# Patient Record
Sex: Female | Born: 1965 | Hispanic: Yes | Smoking: Never smoker
Health system: Southern US, Community
[De-identification: ages and names within clinical notes are randomized; demographics above are authoritative.]

## PROBLEM LIST (undated history)

## (undated) DIAGNOSIS — E039 Hypothyroidism, unspecified: Secondary | ICD-10-CM

## (undated) DIAGNOSIS — M5136 Other intervertebral disc degeneration, lumbar region: Secondary | ICD-10-CM

## (undated) DIAGNOSIS — M47814 Spondylosis without myelopathy or radiculopathy, thoracic region: Secondary | ICD-10-CM

## (undated) DIAGNOSIS — E041 Nontoxic single thyroid nodule: Secondary | ICD-10-CM

## (undated) DIAGNOSIS — U071 COVID-19: Secondary | ICD-10-CM

## (undated) HISTORY — PX: TONSILLECTOMY: SUR1361

## (undated) HISTORY — PX: ABDOMINAL HYSTERECTOMY: SHX81

## (undated) HISTORY — DX: Other intervertebral disc degeneration, lumbar region: M51.36

## (undated) HISTORY — DX: Spondylosis without myelopathy or radiculopathy, thoracic region: M47.814

## (undated) HISTORY — DX: Nontoxic single thyroid nodule: E04.1

## (undated) HISTORY — DX: Hypothyroidism, unspecified: E03.9

---

## 2018-06-28 ENCOUNTER — Other Ambulatory Visit: Payer: Self-pay

## 2018-06-28 ENCOUNTER — Encounter (HOSPITAL_COMMUNITY): Payer: Self-pay | Admitting: Emergency Medicine

## 2018-06-28 ENCOUNTER — Emergency Department (HOSPITAL_COMMUNITY)
Admission: EM | Admit: 2018-06-28 | Discharge: 2018-06-28 | Disposition: A | Discharge status: Home / Self Care | Payer: Self-pay | Attending: Emergency Medicine | Admitting: Emergency Medicine

## 2018-06-28 DIAGNOSIS — B349 Viral infection, unspecified: Secondary | ICD-10-CM | POA: Insufficient documentation

## 2018-06-28 NOTE — ED Provider Notes (Signed)
[facility]-EMERGENCY DEPT Provider Note   CSN: [id number] Arrival date & time: 06/28/18  1625    History   Chief Complaint Chief Complaint  Patient presents with  . Fever  . Shortness of Breath  . Cough  . Sore Throat  . Otalgia    HPI Krystal Reyes is a 53 y.o. female.     HPI   She presents for evaluation of sore throat, cough, fever, chills, general achiness, for 4 days.  No known sick exposures.  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  She denies tobacco abuse, asthma, heart failure, or chronic illnesses.  There are no other known modifying factors.   History reviewed. No pertinent past medical history.  There are no active problems to display for this patient.   Past Surgical History:  Procedure Laterality Date  . ABDOMINAL HYSTERECTOMY    . TONSILLECTOMY       OB History   No obstetric history on file.      Home Medications    Prior to Admission medications   Not on File    Family History No family history on file.  Social History Social History   Tobacco Use  . Smoking status: Never Smoker  . Smokeless tobacco: Never Used  Substance Use Topics  . Alcohol use: Not on file  . Drug use: Not on file     Allergies   Patient has no known allergies.   Review of Systems Review of Systems  All other systems reviewed and are negative.    Physical Exam Updated Vital Signs BP 118/89 (BP Location: Left Arm)   Pulse 70   Temp 98.3 F (36.8 C) (Oral)   Resp 19   SpO2 99%   Physical Exam Vitals signs and nursing note reviewed.  Constitutional:      General: She is not in acute distress.    Appearance: She is well-developed. She is not ill-appearing or diaphoretic.  HENT:     Head: Normocephalic and atraumatic.     Mouth/Throat:     Pharynx: No pharyngeal swelling.  Eyes:     Conjunctiva/sclera: Conjunctivae normal.     Pupils: Pupils are equal, round, and reactive to  light.  Neck:     Musculoskeletal: Normal range of motion and neck supple.     Trachea: Phonation normal.  Cardiovascular:     Rate and Rhythm: Normal rate and regular rhythm.  Pulmonary:     Effort: Pulmonary effort is normal. No respiratory distress.     Breath sounds: Normal breath sounds. No stridor. No wheezing or rhonchi.     Comments: Persistent dry cough. Chest:     Chest wall: No tenderness.  Abdominal:     General: There is no distension.     Palpations: Abdomen is soft.     Tenderness: There is no abdominal tenderness. There is no guarding.  Musculoskeletal: Normal range of motion.  Skin:    General: Skin is warm and dry.  Neurological:     Mental Status: She is alert and oriented to person, place, and time.     Cranial Nerves: No cranial nerve deficit.     Sensory: No sensory deficit.     Motor: No abnormal muscle tone.     Coordination: Coordination normal.  Psychiatric:        Behavior: Behavior normal.        Thought Content: Thought content normal.  Judgment: Judgment normal.      ED Treatments / Results  Labs (all labs ordered are listed, but only abnormal results are displayed) Labs Reviewed - No data to display  EKG None  Radiology No results found.  Procedures Procedures (including critical care time)  Medications Ordered in ED Medications - No data to display   Initial Impression / Assessment and Plan / ED Course  I have reviewed the triage vital signs and the nursing notes.  Pertinent labs & imaging results that were available during my care of the patient were reviewed by me and considered in my medical decision making (see chart for details).         Patient Vitals for the past 24 hrs:  BP Temp Temp src Pulse Resp SpO2  06/28/18 1639 118/89 98.3 F (36.8 C) Oral 70 19 99 %  06/28/18 1636 - - - 70 - 99 %    4:50 PM Reevaluation with update and discussion. After initial assessment and treatment, an updated evaluation  reveals no change in clinical status.  Findings discussed with the patient and all questions were answered. Mancel Bale   Medical Decision Making: Short-term illness, with fever, and cough.  Vital signs normal.  Suspect viral illness.  No known sick contacts.  Differential diagnosis includes Covid-19, influenza, nonspecific URI.  Doubt serious bacterial infection, metabolic instability or impending vascular collapse.  CRITICAL CARE-no Performed by: Mancel Bale   Nursing Notes Reviewed/ Care Coordinated Applicable Imaging Reviewed Interpretation of Laboratory Data incorporated into ED treatment  The patient appears reasonably screened and/or stabilized for discharge and I doubt any other medical condition or other [facility] requiring further screening, evaluation, or treatment in the ED at this time prior to discharge.  Plan: Home Medications-OTC symptomatic treatment; Home Treatments-rest, fluids, self quarantine for 2 weeks; return here if the recommended treatment, does not improve the symptoms; Recommended follow up-PCP of choice PRN    Final Clinical Impressions(s) / ED Diagnoses   Final diagnoses:  Viral illness    ED Discharge Orders    None       Mancel Bale, MD 06/28/18 1656

## 2018-06-28 NOTE — ED Triage Notes (Signed)
Pt reports took tylenol at 345pm today.

## 2018-06-28 NOTE — ED Triage Notes (Signed)
Pt c/o dry cough, fevers, congestion, body aches, chills, sore throat, ear pain for 4 days. 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 denies any recent travel.

## 2018-06-28 NOTE — Discharge Instructions (Addendum)
Get plenty of rest and drink lots of fluids.  Use Tylenol, every 4 hours for fever.  For cough use Robitussin-DM.  You likely have a virus, and may have Covid-19.  Do not work, or expose yourself to other people, for at least 2 weeks.  You need to self isolate and quarantine yourself, for 2 weeks.  There is no specific treatment for your condition.  If your condition worsens you can return here for reassessment.  Watch for worsening cough, fever, inability to eat, weakness or confusion.

## 2018-12-19 ENCOUNTER — Encounter: Payer: Self-pay | Admitting: Gastroenterology

## 2019-01-22 ENCOUNTER — Other Ambulatory Visit: Payer: Self-pay

## 2019-01-22 NOTE — Progress Notes (Signed)
New paper paperwork

## 2019-01-23 ENCOUNTER — Ambulatory Visit: Payer: Self-pay | Admitting: Gastroenterology

## 2019-03-03 ENCOUNTER — Emergency Department (HOSPITAL_COMMUNITY): Payer: Self-pay

## 2019-03-03 ENCOUNTER — Encounter (HOSPITAL_COMMUNITY): Payer: Self-pay | Admitting: Emergency Medicine

## 2019-03-03 ENCOUNTER — Other Ambulatory Visit: Payer: Self-pay

## 2019-03-03 ENCOUNTER — Emergency Department (HOSPITAL_COMMUNITY)
Admission: EM | Admit: 2019-03-03 | Discharge: 2019-03-03 | Disposition: A | Discharge status: Home / Self Care | Payer: Self-pay | Attending: Emergency Medicine | Admitting: Emergency Medicine

## 2019-03-03 DIAGNOSIS — R197 Diarrhea, unspecified: Secondary | ICD-10-CM

## 2019-03-03 DIAGNOSIS — R059 Cough, unspecified: Secondary | ICD-10-CM

## 2019-03-03 DIAGNOSIS — U071 COVID-19: Secondary | ICD-10-CM | POA: Insufficient documentation

## 2019-03-03 DIAGNOSIS — R05 Cough: Secondary | ICD-10-CM

## 2019-03-03 DIAGNOSIS — R0602 Shortness of breath: Secondary | ICD-10-CM

## 2019-03-03 DIAGNOSIS — E039 Hypothyroidism, unspecified: Secondary | ICD-10-CM | POA: Insufficient documentation

## 2019-03-03 HISTORY — DX: COVID-19: U07.1

## 2019-03-03 LAB — BASIC METABOLIC PANEL
Anion gap: 8 (ref 5–15)
BUN: 9 mg/dL (ref 6–20)
CO2: 24 mmol/L (ref 22–32)
Calcium: 8.6 mg/dL — ABNORMAL LOW (ref 8.9–10.3)
Chloride: 104 mmol/L (ref 98–111)
Creatinine, Ser: 0.86 mg/dL (ref 0.44–1.00)
GFR calc Af Amer: >60 mL/min (ref >60)
GFR calc non Af Amer: >60 mL/min (ref >60)
Glucose, Bld: 98 mg/dL (ref 70–99)
Potassium: 4.2 mmol/L (ref 3.5–5.1)
Sodium: 136 mmol/L (ref 135–145)

## 2019-03-03 LAB — I-STAT BETA HCG BLOOD, ED (MC, WL, AP ONLY): I-stat hCG, quantitative: <5 m[IU]/mL (ref <5)

## 2019-03-03 LAB — CBC
HCT: 46.6 % — ABNORMAL HIGH (ref 36.0–46.0)
Hemoglobin: 14.7 g/dL (ref 12.0–15.0)
MCH: 29.1 pg (ref 26.0–34.0)
MCHC: 31.5 g/dL (ref 30.0–36.0)
MCV: 92.3 fL (ref 80.0–100.0)
Platelets: 130 10*3/uL — ABNORMAL LOW (ref 150–400)
RBC: 5.05 MIL/uL (ref 3.87–5.11)
RDW: 12.4 % (ref 11.5–15.5)
WBC: 4.1 10*3/uL (ref 4.0–10.5)
nRBC: 0 % (ref 0.0–0.2)

## 2019-03-03 LAB — D-DIMER, QUANTITATIVE: D-Dimer, Quant: 1.42 ug/mL-FEU — ABNORMAL HIGH (ref 0.00–0.50)

## 2019-03-03 LAB — TROPONIN I (HIGH SENSITIVITY): Troponin I (High Sensitivity): 4 ng/L (ref <18)

## 2019-03-03 MED ORDER — IOHEXOL 350 MG/ML SOLN
63.0000 mL | Freq: Once | INTRAVENOUS | Status: AC | PRN
  Administered 2019-03-03: 63 mL via INTRAVENOUS

## 2019-03-03 MED ORDER — KETOROLAC TROMETHAMINE 30 MG/ML IJ SOLN
30.0000 mg | Freq: Once | INTRAMUSCULAR | Status: AC
  Administered 2019-03-03: 30 mg via INTRAVENOUS
  Filled 2019-03-03: qty 1

## 2019-03-03 MED ORDER — ONDANSETRON 4 MG PO TBDP: 4.0000 mg | ORAL_TABLET | Freq: Three times a day (TID) | ORAL | 0 refills | Status: DC | PRN

## 2019-03-03 MED ORDER — AEROCHAMBER PLUS FLO-VU LARGE MISC
Status: AC
  Administered 2019-03-03: 19:00:00
  Filled 2019-03-03: qty 1

## 2019-03-03 MED ORDER — ONDANSETRON HCL 4 MG/2ML IJ SOLN
4.0000 mg | Freq: Once | INTRAMUSCULAR | Status: AC
  Administered 2019-03-03: 4 mg via INTRAVENOUS
  Filled 2019-03-03: qty 2

## 2019-03-03 MED ORDER — LOPERAMIDE HCL 2 MG PO CAPS: 2.0000 mg | ORAL_CAPSULE | Freq: Four times a day (QID) | ORAL | 0 refills | Status: AC | PRN

## 2019-03-03 MED ORDER — BENZONATATE 100 MG PO CAPS: 100.0000 mg | ORAL_CAPSULE | Freq: Three times a day (TID) | ORAL | 0 refills | Status: DC

## 2019-03-03 MED ORDER — ALBUTEROL SULFATE HFA 108 (90 BASE) MCG/ACT IN AERS: 2.0000 | INHALATION_SPRAY | RESPIRATORY_TRACT | 0 refills | Status: AC | PRN

## 2019-03-03 MED ORDER — SODIUM CHLORIDE 0.9% FLUSH
3.0000 mL | Freq: Once | INTRAVENOUS | Status: AC
  Administered 2019-03-03: 3 mL via INTRAVENOUS

## 2019-03-03 MED ORDER — SODIUM CHLORIDE 0.9 % IV BOLUS
1000.0000 mL | Freq: Once | INTRAVENOUS | Status: AC
  Administered 2019-03-03: 1000 mL via INTRAVENOUS

## 2019-03-03 MED ORDER — ALBUTEROL SULFATE HFA 108 (90 BASE) MCG/ACT IN AERS
6.0000 | INHALATION_SPRAY | Freq: Once | RESPIRATORY_TRACT | Status: AC
  Administered 2019-03-03: 6 via RESPIRATORY_TRACT
  Filled 2019-03-03: qty 6.7

## 2019-03-03 NOTE — ED Notes (Signed)
Patient verbalizes understanding of discharge instructions. Opportunity for questioning and answers were provided. Armband removed by staff, pt discharged from ED.  

## 2019-03-03 NOTE — ED Provider Notes (Signed)
[facility] EMERGENCY DEPARTMENT Provider Note   CSN: [id number] Arrival date & time: 03/03/19  1420    History   Chief Complaint Chief Complaint  Patient presents with   COVID   Fever   Shortness of Breath   HPI Krystal Reyes is a 53 y.o. female with past medical history significant for hypothyroidism, hysterectomy who presents for evaluation of multiple complaints.  Patient tested positive for COVID-19 after work, [employer] on 03/28/2019.  Patient states she has had a nonproductive cough, pleuritic chest pain, fever, chills, body aches and pains, headache, diarrhea over the last week.  She has been taking Tylenol ibuprofen intermittently.  She did have a televisit with her PCP who prescribed her a cough medicine however she has not picked this up from the drugstore.  Patient states she is also felt short of breath.  Chest pain is pleuritic in nature.  No associated diaphoresis, dizziness, lightheadedness.  Denies chest pain radiating to her back, left arm or jaw.  CO peuritic in nature.  No prior history history of PE, DVT, ACS.  T-max 102.5.  Denies sudden onset medical headache, dizziness, lightheadedness, vision changes, dyspnea, rhinorrhea, neck pain, neck stiffness, congestion, rhinorrhea, sore throat, dysuria, melena, BRBPR, unilateral weakness, abdominal pain, melena, dysphagia. Admits to decreased appetite however has been able to tolerate. Denies additional aggravating or alleviating factors.  No recent surgery, most recently, history of malignancy. No swelling to legs.  No recent antibiotic use or travel.   History obtained from patient and past medical records. No interpretor was used.     HPI  Past Medical History:  Diagnosis Date   COVID-19    Degeneration of lumbar intervertebral disc    Hypothyroidism    Nodular thyroid disease    Thoracic spondylosis     There are no active problems to display for this patient.   Past Surgical  History:  Procedure Laterality Date   ABDOMINAL HYSTERECTOMY     CESAREAN SECTION     TONSILLECTOMY       OB History   No obstetric history on file.      Home Medications    Prior to Admission medications   Medication Sig Start Date End Date Taking? Authorizing Provider  albuterol (VENTOLIN HFA) 108 (90 Base) MCG/ACT inhaler Inhale 2 puffs into the lungs every 4 (four) hours as needed for wheezing or shortness of breath. 03/03/19   Beretta Ginsberg A, PA-C  benzonatate (TESSALON) 100 MG capsule Take 1 capsule (100 mg total) by mouth every 8 (eight) hours. 03/03/19   Tasheem Elms A, PA-C  loperamide (IMODIUM) 2 MG capsule Take 1 capsule (2 mg total) by mouth 4 (four) times daily as needed for diarrhea or loose stools. 03/03/19   Gracen Ringwald A, PA-C  ondansetron (ZOFRAN ODT) 4 MG disintegrating tablet Take 1 tablet (4 mg total) by mouth every 8 (eight) hours as needed for nausea or vomiting. 03/03/19   Athenia Rys A, PA-C    Family History Family History  Problem Relation Age of Onset   Hypertension Mother    Diabetes Father    Asthma Father    Diabetes Sister        x 4 sisters   Hypertension Sister    Diabetes Maternal Grandmother    Asthma Son     Social History Social History   Tobacco Use   Smoking status: Never Smoker   Smokeless tobacco: Never Used  Substance Use Topics   Alcohol use: Never  Frequency: Never   Drug use: Never     Allergies   Patient has no known allergies.   Review of Systems Review of Systems  Constitutional: Positive for activity change, appetite change, chills, fatigue and fever.  HENT: Negative.   Eyes: Negative.   Respiratory: Positive for cough and shortness of breath. Negative for apnea, choking, chest tightness, wheezing and stridor.   Cardiovascular: Positive for chest pain (Pleuritic). Negative for palpitations and leg swelling.  Gastrointestinal: Positive for diarrhea and nausea. Negative for  abdominal distention, abdominal pain, anal bleeding, blood in stool, constipation, rectal pain and vomiting.  Genitourinary: Negative.   Musculoskeletal: Negative.   Skin: Negative.   Neurological: Positive for headaches. Negative for dizziness, tremors, seizures, syncope, facial asymmetry, speech difficulty, weakness, light-headedness and numbness.  Hematological: Negative.   All other systems reviewed and are negative.   Physical Exam Updated Vital Signs BP 134/77    Pulse 88    Temp 99.4 F (37.4 C) (Oral)    Resp (!) 22    SpO2 100%   Physical Exam Vitals signs and nursing note reviewed.  Constitutional:      General: She is not in acute distress.    Appearance: She is well-developed. She is ill-appearing. She is not toxic-appearing or diaphoretic.  HENT:     Head: Normocephalic and atraumatic.     Jaw: There is normal jaw occlusion.     Right Ear: Tympanic membrane, ear canal and external ear normal. There is no impacted cerumen. No hemotympanum. Tympanic membrane is not injected, scarred, perforated, erythematous, retracted or bulging.     Left Ear: Tympanic membrane, ear canal and external ear normal. There is no impacted cerumen. No hemotympanum. Tympanic membrane is not injected, scarred, perforated, erythematous, retracted or bulging.     Ears:     Comments: No Mastoid tenderness.    Nose:     Comments: Clear rhinorrhea and congestion to bilateral nares.  No sinus tenderness.    Mouth/Throat:     Mouth: Mucous membranes are moist.     Comments: Posterior oropharynx clear.  Mucous membranes moist.  Tonsils without erythema or exudate.  Uvula midline without deviation.  No evidence of PTA or RPA.  No drooling, dysphasia or trismus.  Phonation normal. Eyes:     Pupils: Pupils are equal, round, and reactive to light.     Comments: No horizontal, vertical or rotational nystagmus  Neck:     Musculoskeletal: Normal range of motion.     Trachea: Trachea and phonation normal.      Meningeal: Brudzinski's sign and Kernig's sign absent.     Comments: Full active and passive ROM without pain No midline or paraspinal tenderness No nuchal rigidity or meningeal signs  Cardiovascular:     Rate and Rhythm: Normal rate.     Comments: No murmurs rubs or gallops. Pulmonary:     Effort: No respiratory distress.     Comments: Clear to auscultation bilaterally without wheeze, rhonchi or rales.  No accessory muscle usage.  Able speak in full sentences. Abdominal:     General: There is no distension.     Comments: Soft, nontender without rebound or guarding.  No CVA tenderness.  Musculoskeletal: Normal range of motion.     Comments: Moves all 4 extremities without difficulty.  Lower extremities without edema, erythema or warmth.  Skin:    General: Skin is warm and dry.     Comments: Brisk capillary refill.  No rashes or lesions.  Neurological:  Mental Status: She is alert.     Comments: Mental Status:  Alert, oriented, thought content appropriate. Speech fluent without evidence of aphasia. Able to follow 2 step commands without difficulty.  Cranial Nerves:  II:  Peripheral visual fields grossly normal, pupils equal, round, reactive to light III,IV, VI: ptosis not present, extra-ocular motions intact bilaterally  V,VII: smile symmetric, facial light touch sensation equal VIII: hearing grossly normal bilaterally  IX,X: midline uvula rise  XI: bilateral shoulder shrug equal and strong XII: midline tongue extension  Motor:  5/5 in upper and lower extremities bilaterally including strong and equal grip strength and dorsiflexion/plantar flexion Sensory: Pinprick and light touch normal in all extremities.  Deep Tendon Reflexes: 2+ and symmetric  Cerebellar: normal finger-to-nose with bilateral upper extremities Gait: normal gait and balance CV: distal pulses palpable throughout      ED Treatments / Results  Labs (all labs ordered are listed, but only abnormal results are  displayed) Labs Reviewed  CBC - Abnormal; Notable for the following components:      Result Value   HCT 46.6 (*)    Platelets 130 (*)    All other components within normal limits  D-DIMER, QUANTITATIVE (NOT AT [facility]) - Abnormal; Notable for the following components:   D-Dimer, Quant 1.42 (*)    All other components within normal limits  BASIC METABOLIC PANEL - Abnormal; Notable for the following components:   Calcium 8.6 (*)    All other components within normal limits  I-STAT BETA HCG BLOOD, ED ([facility], [facility], [facility] ONLY)  TROPONIN I (HIGH SENSITIVITY)    EKG None  Radiology Ct Angio Chest Pe W/cm &/or Wo Cm  Result Date: 03/03/2019 CLINICAL DATA:  53 year old female with shortness of breath. Positive COVID-19. EXAM: CT ANGIOGRAPHY CHEST WITH CONTRAST TECHNIQUE: Multidetector CT imaging of the chest was performed using the standard protocol during bolus administration of intravenous contrast. Multiplanar CT image reconstructions and MIPs were obtained to evaluate the vascular anatomy. CONTRAST:  63mL OMNIPAQUE IOHEXOL 350 MG/ML SOLN COMPARISON:  Chest radiograph dated 03/03/2019. FINDINGS: Cardiovascular: There is no cardiomegaly or pericardial effusion. The thoracic aorta is unremarkable. Evaluation of the pulmonary arteries is limited due to streak artifact. No pulmonary artery embolus identified. Mediastinum/Nodes: There is no hilar or mediastinal adenopathy. The esophagus and the thyroid gland are grossly unremarkable as visualized. No mediastinal fluid collection. Lungs/Pleura: Patchy bilateral peripheral and subpleural ground-glass densities consistent with multifocal pneumonia, likely atypical or viral in etiology, including COVID-19. Clinical correlation and follow-up to resolution recommended. There is no pleural effusion or pneumothorax. The central airways are patent. Upper Abdomen: No acute abnormality. Musculoskeletal: No chest wall abnormality. No acute or significant osseous findings.  Review of the MIP images confirms the above findings. IMPRESSION: 1. No CT evidence of pulmonary embolism. 2. Bilateral peripheral and subpleural ground-glass densities consistent with multifocal pneumonia, likely atypical or viral in etiology. Clinical correlation and follow-up to resolution recommended. Electronically Signed   By: Elgie CollardArash  Radparvar M.D.   On: 03/03/2019 21:02   Dg Chest Portable 1 View  Result Date: 03/03/2019 CLINICAL DATA:  COVID-19 positive, vomiting, diarrhea, fever, shortness of breath and central chest pain EXAM: PORTABLE CHEST 1 VIEW COMPARISON:  Portable exam 1628 hours compared to 12/26/2017 FINDINGS: Normal heart size, mediastinal contours, and pulmonary vascularity. Lungs clear. No pulmonary infiltrate, pleural effusion or pneumothorax. Osseous structures unremarkable. IMPRESSION: No acute abnormalities. Electronically Signed   By: Ulyses SouthwardMark  Boles M.D.   On: 03/03/2019 17:12    Procedures  Procedures (including critical care time)  Medications Ordered in ED Medications  sodium chloride flush (NS) 0.9 % injection 3 mL (3 mLs Intravenous Given 03/03/19 1853)  sodium chloride 0.9 % bolus 1,000 mL (0 mLs Intravenous Stopped 03/03/19 2000)  ondansetron (ZOFRAN) injection 4 mg (4 mg Intravenous Given 03/03/19 1848)  ketorolac (TORADOL) 30 MG/ML injection 30 mg (30 mg Intravenous Given 03/03/19 1848)  albuterol (VENTOLIN HFA) 108 (90 Base) MCG/ACT inhaler 6 puff (6 puffs Inhalation Given 03/03/19 1915)  AeroChamber Plus Flo-Vu Large MISC (  Given 03/03/19 1915)  iohexol (OMNIPAQUE) 350 MG/ML injection 63 mL (63 mLs Intravenous Contrast Given 03/03/19 2053)    Initial Impression / Assessment and Plan / ED Course  I have reviewed the triage vital signs and the nursing notes.  Pertinent labs & imaging results that were available during my care of the patient were reviewed by me and considered in my medical decision making (see chart for details).  53 year old female appears nonseptic  however appears to not feel well presents for evaluation of multiple complaints.  Afebrile.  Known Covid positive on 03/01/2019.  Patient has pleuritic chest pain, shortness of breath, persistent nausea, headache, body aches and pains as well as diarrhea Rea.  Her abdomen is soft, nontender.  Nonfocal abdominal exam.  Nonsurgical abdomen.  Clear.  She is able speak in full sentences without difficulty.  No neck stiffness or neck rigidity.  No meningismus.  No evidence of DVT on exam.  She is febrile however not tachycardic, tachypneic or hypoxic.  She did take 1000 mg of Tylenol 30 minutes PTA.  Generalized headache however denies sudden onset thunderclap headache, dizziness, vision changes.No sudden onset thunderclap headache.   Reassessed.  Headache resolved with Toradol.  She ambulated in room with oxygen saturations greater than 96% on room air.  She has no tachycardia or tachypnea.  She did defervesce with the Toradol from the emergency department.  Her abdomen exam remains benign.  No focal tenderness.  I have low suspicion for bowel obstruction, acute bacterial infectious process as cause of her diarrhea.  Do not think she needs CT imaging at this time.  Denies any recent travel or antibiotic use.  I have low suspicion for C. difficile or need for antibiotics for her diarrhea.  She did have an elevated D-dimer.  Her CTA of her chest did not show any evidence of pulmonary embolism however she does have multifocal pneumonia likely atypical versus viral in etiology.  Given known Covid positive I feel this is likely the cause.  Patient does not appear septic or ill.  Her vital signs remained stable in the emergency department.  Discussed symptomatic management with patient at home.  Prescriptions given.  Her lungs and heart remain clear.  She had negative delta troponins.  I have low suspicion for ACS, PE, dissection, myocarditis, pericarditis as cause of her symptoms.  She is tolerating p.o. intake in ED  without difficulty.  Ambulatory without difficulty and continues to be neurologically intact.HA presentation non concerning for [facility], ICH, Meningitis, or temporal arteritis.  Patient is nontoxic, nonseptic appearing, in no apparent distress.  Patient's pain and other symptoms adequately managed in emergency department.  Fluid bolus given.  Labs, imaging and vitals reviewed.  Patient does not meet the SIRS or Sepsis criteria.  On repeat exam patient does not have a surgical abdomin and there are no peritoneal signs.  No indication of appendicitis, bowel obstruction, bowel perforation, cholecystitis, diverticulitis, PID or ectopic pregnancy.  Patient  discharged home with symptomatic treatment and given strict instructions for follow-up with their primary care physician.  I have also discussed reasons to return immediately to the ER.  Patient expresses understanding and agrees with plan.       Krystal Reyes was evaluated in Emergency Department on 03/03/2019 for the symptoms described in the history of present illness. She was evaluated in the context of the global COVID-19 pandemic, which necessitated consideration that the patient might be at risk for infection with the SARS-CoV-2 virus that causes COVID-19. Institutional protocols and algorithms that pertain to the evaluation of patients at risk for COVID-19 are in a state of rapid change based on information released by regulatory bodies including the CDC and federal and state organizations. These policies and algorithms were followed during the patient's care in the ED. Final Clinical Impressions(s) / ED Diagnoses   Final diagnoses:  COVID-19  Cough  SOB (shortness of breath)  Diarrhea, unspecified type    ED Discharge Orders         Ordered    benzonatate (TESSALON) 100 MG capsule  Every 8 hours     03/03/19 2125    albuterol (VENTOLIN HFA) 108 (90 Base) MCG/ACT inhaler  Every 4 hours PRN     03/03/19 2125    loperamide (IMODIUM) 2 MG capsule   4 times daily PRN     03/03/19 2125    ondansetron (ZOFRAN ODT) 4 MG disintegrating tablet  Every 8 hours PRN     03/03/19 2125           Sion Thane A, PA-C 03/03/19 2130    Sabas Sous, MD 03/04/19 2018

## 2019-03-03 NOTE — Discharge Instructions (Signed)
I have given you a few prescriptions to help with your symptoms from Covid.  I have given you Zofran.  This is a disintegrating tablet they placed under your tongue for nausea and vomiting.  I have also written you prescription for Imodium for your diarrhea.  You do have an albuterol inhaler that you taking home with you from the emergency department however I did write for additional prescription of this.  You may take 3 to 4 puffs every 4 hours as needed for shortness of breath.  I also write for Tessalon Perles to help with your cough.  Make sure to drink plenty of fluids and rest.  Continue to take Tylenol and ibuprofen as needed for fever.  Do not take more than 4000 mg Tylenol or more than 2400 mg ibuprofen daily.  Return to the emergency department if you develop any new or worsening symptoms.

## 2019-03-03 NOTE — ED Notes (Signed)
Meal given. Dropped off by family.

## 2019-03-03 NOTE — ED Triage Notes (Signed)
Pt states she is COVID +.  C/o vomiting, diarrhea, fever, SOB, and pain to center of chest.

## 2019-03-07 ENCOUNTER — Emergency Department (HOSPITAL_COMMUNITY): Payer: HRSA Program

## 2019-03-07 ENCOUNTER — Other Ambulatory Visit: Payer: Self-pay

## 2019-03-07 ENCOUNTER — Inpatient Hospital Stay (HOSPITAL_COMMUNITY)
Admission: EM | Admit: 2019-03-07 | Discharge: 2019-03-11 | DRG: 177 | Disposition: A | Discharge status: Home / Self Care | Payer: HRSA Program | Attending: Family Medicine | Admitting: Family Medicine

## 2019-03-07 ENCOUNTER — Encounter (HOSPITAL_COMMUNITY): Payer: Self-pay | Admitting: Emergency Medicine

## 2019-03-07 DIAGNOSIS — Z8249 Family history of ischemic heart disease and other diseases of the circulatory system: Secondary | ICD-10-CM

## 2019-03-07 DIAGNOSIS — F419 Anxiety disorder, unspecified: Secondary | ICD-10-CM | POA: Diagnosis not present

## 2019-03-07 DIAGNOSIS — U071 COVID-19: Secondary | ICD-10-CM | POA: Diagnosis present

## 2019-03-07 DIAGNOSIS — D72828 Other elevated white blood cell count: Secondary | ICD-10-CM | POA: Diagnosis not present

## 2019-03-07 DIAGNOSIS — Y92230 Patient room in hospital as the place of occurrence of the external cause: Secondary | ICD-10-CM | POA: Diagnosis not present

## 2019-03-07 DIAGNOSIS — J1289 Other viral pneumonia: Secondary | ICD-10-CM | POA: Diagnosis present

## 2019-03-07 DIAGNOSIS — I493 Ventricular premature depolarization: Secondary | ICD-10-CM | POA: Diagnosis present

## 2019-03-07 DIAGNOSIS — M5136 Other intervertebral disc degeneration, lumbar region: Secondary | ICD-10-CM | POA: Diagnosis present

## 2019-03-07 DIAGNOSIS — E039 Hypothyroidism, unspecified: Secondary | ICD-10-CM | POA: Diagnosis present

## 2019-03-07 DIAGNOSIS — Z9089 Acquired absence of other organs: Secondary | ICD-10-CM

## 2019-03-07 DIAGNOSIS — Z833 Family history of diabetes mellitus: Secondary | ICD-10-CM

## 2019-03-07 DIAGNOSIS — Z90710 Acquired absence of both cervix and uterus: Secondary | ICD-10-CM

## 2019-03-07 DIAGNOSIS — M47814 Spondylosis without myelopathy or radiculopathy, thoracic region: Secondary | ICD-10-CM | POA: Diagnosis present

## 2019-03-07 DIAGNOSIS — R0902 Hypoxemia: Secondary | ICD-10-CM

## 2019-03-07 DIAGNOSIS — R0602 Shortness of breath: Secondary | ICD-10-CM | POA: Diagnosis present

## 2019-03-07 DIAGNOSIS — D72829 Elevated white blood cell count, unspecified: Secondary | ICD-10-CM | POA: Diagnosis not present

## 2019-03-07 DIAGNOSIS — Z888 Allergy status to other drugs, medicaments and biological substances status: Secondary | ICD-10-CM

## 2019-03-07 DIAGNOSIS — I2699 Other pulmonary embolism without acute cor pulmonale: Secondary | ICD-10-CM

## 2019-03-07 DIAGNOSIS — Z825 Family history of asthma and other chronic lower respiratory diseases: Secondary | ICD-10-CM

## 2019-03-07 DIAGNOSIS — J9601 Acute respiratory failure with hypoxia: Secondary | ICD-10-CM | POA: Diagnosis present

## 2019-03-07 DIAGNOSIS — T380X5A Adverse effect of glucocorticoids and synthetic analogues, initial encounter: Secondary | ICD-10-CM | POA: Diagnosis not present

## 2019-03-07 LAB — TRIGLYCERIDES: Triglycerides: 61 mg/dL (ref <150)

## 2019-03-07 LAB — CBC
HCT: 40.9 % (ref 36.0–46.0)
HCT: 39.5 % (ref 36.0–46.0)
Hemoglobin: 13.5 g/dL (ref 12.0–15.0)
Hemoglobin: 12.8 g/dL (ref 12.0–15.0)
MCH: 28.7 pg (ref 26.0–34.0)
MCH: 29.2 pg (ref 26.0–34.0)
MCHC: 33 g/dL (ref 30.0–36.0)
MCHC: 32.4 g/dL (ref 30.0–36.0)
MCV: 86.8 fL (ref 80.0–100.0)
MCV: 90 fL (ref 80.0–100.0)
Platelets: 179 10*3/uL (ref 150–400)
Platelets: 157 10*3/uL (ref 150–400)
RBC: 4.71 MIL/uL (ref 3.87–5.11)
RBC: 4.39 MIL/uL (ref 3.87–5.11)
RDW: 11.9 % (ref 11.5–15.5)
RDW: 11.9 % (ref 11.5–15.5)
WBC: 4.2 10*3/uL (ref 4.0–10.5)
WBC: 4.2 10*3/uL (ref 4.0–10.5)
nRBC: 0 % (ref 0.0–0.2)
nRBC: 0 % (ref 0.0–0.2)

## 2019-03-07 LAB — BASIC METABOLIC PANEL
Anion gap: 9 (ref 5–15)
BUN: 10 mg/dL (ref 6–20)
CO2: 25 mmol/L (ref 22–32)
Calcium: 9.2 mg/dL (ref 8.9–10.3)
Chloride: 105 mmol/L (ref 98–111)
Creatinine, Ser: 0.71 mg/dL (ref 0.44–1.00)
GFR calc Af Amer: >60 mL/min (ref >60)
GFR calc non Af Amer: >60 mL/min (ref >60)
Glucose, Bld: 104 mg/dL — ABNORMAL HIGH (ref 70–99)
Potassium: 4.1 mmol/L (ref 3.5–5.1)
Sodium: 139 mmol/L (ref 135–145)

## 2019-03-07 LAB — COMPREHENSIVE METABOLIC PANEL
ALT: 23 U/L (ref 0–44)
AST: 35 U/L (ref 15–41)
Albumin: 2.8 g/dL — ABNORMAL LOW (ref 3.5–5.0)
Alkaline Phosphatase: 34 U/L — ABNORMAL LOW (ref 38–126)
Anion gap: 10 (ref 5–15)
BUN: 10 mg/dL (ref 6–20)
CO2: 23 mmol/L (ref 22–32)
Calcium: 8.8 mg/dL — ABNORMAL LOW (ref 8.9–10.3)
Chloride: 104 mmol/L (ref 98–111)
Creatinine, Ser: 0.78 mg/dL (ref 0.44–1.00)
GFR calc Af Amer: >60 mL/min (ref >60)
GFR calc non Af Amer: >60 mL/min (ref >60)
Glucose, Bld: 91 mg/dL (ref 70–99)
Potassium: 3.9 mmol/L (ref 3.5–5.1)
Sodium: 137 mmol/L (ref 135–145)
Total Bilirubin: 0.6 mg/dL (ref 0.3–1.2)
Total Protein: 5.7 g/dL — ABNORMAL LOW (ref 6.5–8.1)

## 2019-03-07 LAB — I-STAT BETA HCG BLOOD, ED (MC, WL, AP ONLY): I-stat hCG, quantitative: <5 m[IU]/mL (ref <5)

## 2019-03-07 LAB — FIBRINOGEN: Fibrinogen: 570 mg/dL — ABNORMAL HIGH (ref 210–475)

## 2019-03-07 LAB — PROCALCITONIN: Procalcitonin: <0.1 ng/mL

## 2019-03-07 LAB — D-DIMER, QUANTITATIVE: D-Dimer, Quant: 1.73 ug/mL-FEU — ABNORMAL HIGH (ref 0.00–0.50)

## 2019-03-07 LAB — CBG MONITORING, ED
Glucose-Capillary: 130 mg/dL — ABNORMAL HIGH (ref 70–99)
Glucose-Capillary: 146 mg/dL — ABNORMAL HIGH (ref 70–99)

## 2019-03-07 LAB — POC SARS CORONAVIRUS 2 AG -  ED: SARS Coronavirus 2 Ag: POSITIVE — AB

## 2019-03-07 LAB — TROPONIN I (HIGH SENSITIVITY)
Troponin I (High Sensitivity): 3 ng/L (ref <18)
Troponin I (High Sensitivity): 4 ng/L (ref <18)

## 2019-03-07 LAB — CREATININE, SERUM
Creatinine, Ser: 0.72 mg/dL (ref 0.44–1.00)
GFR calc Af Amer: >60 mL/min (ref >60)
GFR calc non Af Amer: >60 mL/min (ref >60)

## 2019-03-07 LAB — HIV ANTIBODY (ROUTINE TESTING W REFLEX): HIV Screen 4th Generation wRfx: NONREACTIVE

## 2019-03-07 LAB — LACTATE DEHYDROGENASE: LDH: 284 U/L — ABNORMAL HIGH (ref 98–192)

## 2019-03-07 LAB — LACTIC ACID, PLASMA
Lactic Acid, Venous: 0.8 mmol/L (ref 0.5–1.9)
Lactic Acid, Venous: 0.8 mmol/L (ref 0.5–1.9)

## 2019-03-07 LAB — ABO/RH: ABO/RH(D): B POS

## 2019-03-07 LAB — FERRITIN: Ferritin: 194 ng/mL (ref 11–307)

## 2019-03-07 LAB — C-REACTIVE PROTEIN: CRP: 11.3 mg/dL — ABNORMAL HIGH (ref <1.0)

## 2019-03-07 MED ORDER — INSULIN ASPART 100 UNIT/ML ~~LOC~~ SOLN
0.0000 [IU] | Freq: Three times a day (TID) | SUBCUTANEOUS | Status: DC
  Administered 2019-03-09 – 2019-03-10 (×2): 1 [IU] via SUBCUTANEOUS
  Administered 2019-03-10: 23:00:00 2 [IU] via SUBCUTANEOUS

## 2019-03-07 MED ORDER — SODIUM CHLORIDE 0.9% FLUSH
3.0000 mL | Freq: Two times a day (BID) | INTRAVENOUS | Status: DC
  Administered 2019-03-07 – 2019-03-11 (×5): 3 mL via INTRAVENOUS

## 2019-03-07 MED ORDER — ENOXAPARIN SODIUM 40 MG/0.4ML ~~LOC~~ SOLN
40.0000 mg | SUBCUTANEOUS | Status: DC
  Administered 2019-03-07 – 2019-03-11 (×4): 40 mg via SUBCUTANEOUS
  Filled 2019-03-07 (×4): qty 0.4

## 2019-03-07 MED ORDER — SODIUM CHLORIDE 0.9 % IV SOLN
100.0000 mg | Freq: Every day | INTRAVENOUS | Status: AC
  Administered 2019-03-08 – 2019-03-11 (×4): 100 mg via INTRAVENOUS
  Filled 2019-03-07 (×3): qty 20
  Filled 2019-03-07: qty 100

## 2019-03-07 MED ORDER — BENZONATATE 100 MG PO CAPS
200.0000 mg | ORAL_CAPSULE | Freq: Three times a day (TID) | ORAL | Status: DC | PRN
  Administered 2019-03-07 – 2019-03-09 (×2): 200 mg via ORAL
  Filled 2019-03-07 (×2): qty 2

## 2019-03-07 MED ORDER — ALBUTEROL SULFATE HFA 108 (90 BASE) MCG/ACT IN AERS
2.0000 | INHALATION_SPRAY | RESPIRATORY_TRACT | Status: DC | PRN
  Administered 2019-03-08 – 2019-03-09 (×2): 2 via RESPIRATORY_TRACT
  Filled 2019-03-07: qty 6.7

## 2019-03-07 MED ORDER — HYDROMORPHONE HCL 1 MG/ML IJ SOLN
0.5000 mg | Freq: Once | INTRAMUSCULAR | Status: AC
  Administered 2019-03-07: 22:00:00 0.5 mg via INTRAVENOUS
  Filled 2019-03-07: qty 1

## 2019-03-07 MED ORDER — DEXAMETHASONE 6 MG PO TABS
6.0000 mg | ORAL_TABLET | ORAL | Status: DC
  Administered 2019-03-07 – 2019-03-11 (×5): 6 mg via ORAL
  Filled 2019-03-07: qty 1
  Filled 2019-03-07 (×4): qty 2

## 2019-03-07 MED ORDER — ACETAMINOPHEN 325 MG PO TABS
650.0000 mg | ORAL_TABLET | Freq: Four times a day (QID) | ORAL | Status: DC | PRN
  Administered 2019-03-09 – 2019-03-10 (×3): 650 mg via ORAL
  Filled 2019-03-07 (×3): qty 2

## 2019-03-07 MED ORDER — GUAIFENESIN-DM 100-10 MG/5ML PO SYRP
10.0000 mL | ORAL_SOLUTION | ORAL | Status: DC | PRN
  Administered 2019-03-08 – 2019-03-10 (×3): 10 mL via ORAL
  Filled 2019-03-07 (×3): qty 10

## 2019-03-07 MED ORDER — ALBUTEROL SULFATE HFA 108 (90 BASE) MCG/ACT IN AERS
2.0000 | INHALATION_SPRAY | Freq: Four times a day (QID) | RESPIRATORY_TRACT | Status: DC
  Administered 2019-03-09 – 2019-03-11 (×9): 2 via RESPIRATORY_TRACT
  Filled 2019-03-07 (×4): qty 6.7

## 2019-03-07 MED ORDER — SODIUM CHLORIDE 0.9% FLUSH
3.0000 mL | Freq: Once | INTRAVENOUS | Status: AC
  Administered 2019-03-07: 3 mL via INTRAVENOUS

## 2019-03-07 MED ORDER — SODIUM CHLORIDE 0.9 % IV SOLN
200.0000 mg | Freq: Once | INTRAVENOUS | Status: AC
  Administered 2019-03-07: 16:00:00 200 mg via INTRAVENOUS
  Filled 2019-03-07: qty 40

## 2019-03-07 MED ORDER — MORPHINE SULFATE (PF) 4 MG/ML IV SOLN
4.0000 mg | Freq: Once | INTRAVENOUS | Status: AC
  Administered 2019-03-07: 21:00:00 4 mg via INTRAVENOUS
  Filled 2019-03-07: qty 1

## 2019-03-07 MED ORDER — ONDANSETRON HCL 4 MG/2ML IJ SOLN
4.0000 mg | Freq: Four times a day (QID) | INTRAMUSCULAR | Status: DC | PRN
  Administered 2019-03-10: 4 mg via INTRAVENOUS
  Filled 2019-03-07: qty 2

## 2019-03-07 MED ORDER — ONDANSETRON HCL 4 MG PO TABS: 4.0000 mg | ORAL_TABLET | Freq: Four times a day (QID) | ORAL | Status: DC | PRN

## 2019-03-07 MED ORDER — INSULIN ASPART 100 UNIT/ML ~~LOC~~ SOLN: 0.0000 [IU] | SUBCUTANEOUS | Status: DC

## 2019-03-07 MED ORDER — FAMOTIDINE 20 MG PO TABS
20.0000 mg | ORAL_TABLET | Freq: Two times a day (BID) | ORAL | Status: DC
  Administered 2019-03-07 – 2019-03-11 (×8): 20 mg via ORAL
  Filled 2019-03-07 (×7): qty 1

## 2019-03-07 MED ORDER — HYDROCOD POLST-CPM POLST ER 10-8 MG/5ML PO SUER
5.0000 mL | Freq: Two times a day (BID) | ORAL | Status: DC | PRN
  Administered 2019-03-09 – 2019-03-10 (×2): 5 mL via ORAL
  Filled 2019-03-07 (×2): qty 5

## 2019-03-07 NOTE — ED Notes (Signed)
Patients husband Ludwig Clarks [phone] requesting to be called when patient moves upstairs. Patient does speak [place].

## 2019-03-07 NOTE — ED Provider Notes (Signed)
[facility] EMERGENCY DEPARTMENT Provider Note   CSN: [id number] Arrival date & time: 03/07/19  7829     History   Chief Complaint Chief Complaint  Patient presents with  . COVID +  . Shortness of Breath  . Chest Pain    HPI Krystal Reyes is a 53 y.o. female.     53 year old female presents with complaint of body aches, diarrhea, chest pain, shortness of breath, dizziness.  Patient states that she has COVID-19, had a positive test through her employer at [facility] [employer] [employer] on December 3, symptom onset 11/30.  Patient reports progressively worsening symptoms.   Krystal Reyes was evaluated in Emergency Department on 03/07/2019 for the symptoms described in the history of present illness. She was evaluated in the context of the global COVID-19 pandemic, which necessitated consideration that the patient might be at risk for infection with the SARS-CoV-2 virus that causes COVID-19. Institutional protocols and algorithms that pertain to the evaluation of patients at risk for COVID-19 are in a state of rapid change based on information released by regulatory bodies including the CDC and federal and state organizations. These policies and algorithms were followed during the patient's care in the ED.      Past Medical History:  Diagnosis Date  . COVID-19   . Degeneration of lumbar intervertebral disc   . Hypothyroidism   . Nodular thyroid disease   . Thoracic spondylosis     Patient Active Problem List   Diagnosis Date Noted  . COVID-19 virus infection 03/07/2019    Past Surgical History:  Procedure Laterality Date  . ABDOMINAL HYSTERECTOMY    . CESAREAN SECTION    . TONSILLECTOMY       OB History   No obstetric history on file.      Home Medications    Prior to Admission medications   Medication Sig Start Date End Date Taking? Authorizing Provider  albuterol (VENTOLIN HFA) 108 (90 Base) MCG/ACT inhaler Inhale 2 puffs into the lungs every 4 (four) hours  as needed for wheezing or shortness of breath. 03/03/19   Henderly, Britni A, PA-C  benzonatate (TESSALON) 100 MG capsule Take 1 capsule (100 mg total) by mouth every 8 (eight) hours. 03/03/19   Henderly, Britni A, PA-C  benzonatate (TESSALON) 200 MG capsule Take 200 mg by mouth 3 (three) times daily as needed. 02/27/19   [provider]  loperamide (IMODIUM) 2 MG capsule Take 1 capsule (2 mg total) by mouth 4 (four) times daily as needed for diarrhea or loose stools. 03/03/19   Henderly, Britni A, PA-C  ondansetron (ZOFRAN ODT) 4 MG disintegrating tablet Take 1 tablet (4 mg total) by mouth every 8 (eight) hours as needed for nausea or vomiting. 03/03/19   Henderly, Britni A, PA-C    Family History Family History  Problem Relation Age of Onset  . Hypertension Mother   . Diabetes Father   . Asthma Father   . Diabetes Sister        x 4 sisters  . Hypertension Sister   . Diabetes Maternal Grandmother   . Asthma Son     Social History Social History   Tobacco Use  . Smoking status: Never Smoker  . Smokeless tobacco: Never Used  Substance Use Topics  . Alcohol use: Never    Frequency: Never  . Drug use: Never     Allergies   Patient has no known allergies.   Review of Systems Review of Systems  Constitutional: Positive for  chills, fatigue and fever.  HENT: Negative for congestion.   Respiratory: Positive for cough and shortness of breath.   Cardiovascular: Positive for chest pain.  Gastrointestinal: Positive for diarrhea. Negative for abdominal pain, nausea and vomiting.  Genitourinary: Negative for dysuria.  Musculoskeletal: Positive for arthralgias and myalgias.  Skin: Negative for rash and wound.  Allergic/Immunologic: Negative for immunocompromised state.  Neurological: Positive for weakness.  Hematological: Negative for adenopathy.  Psychiatric/Behavioral: Negative for confusion.  All other systems reviewed and are negative.    Physical Exam Updated Vital  Signs BP 109/67   Pulse 66   Temp 99.2 F (37.3 C) (Oral)   Resp (!) 28   SpO2 100%   Physical Exam Vitals signs and nursing note reviewed.  Constitutional:      General: She is not in acute distress.    Appearance: She is well-developed. She is not diaphoretic.     Comments: Appears to feel unwell.  HENT:     Head: Normocephalic and atraumatic.  Neck:     Musculoskeletal: Neck supple.  Cardiovascular:     Rate and Rhythm: Normal rate and regular rhythm.  Pulmonary:     Effort: Pulmonary effort is normal.     Breath sounds: No decreased breath sounds.  Chest:     Chest wall: Tenderness present.  Abdominal:     Palpations: Abdomen is soft.     Tenderness: There is no abdominal tenderness.  Musculoskeletal:     Right lower leg: She exhibits no tenderness. No edema.     Left lower leg: She exhibits no tenderness. No edema.  Lymphadenopathy:     Cervical: No cervical adenopathy.  Skin:    General: Skin is warm and dry.     Findings: No erythema or rash.  Neurological:     Mental Status: She is alert and oriented to person, place, and time.  Psychiatric:        Behavior: Behavior normal.      ED Treatments / Results  Labs (all labs ordered are listed, but only abnormal results are displayed) Labs Reviewed  BASIC METABOLIC PANEL - Abnormal; Notable for the following components:      Result Value   Glucose, Bld 104 (*)    All other components within normal limits  D-DIMER, QUANTITATIVE (NOT AT [facility]) - Abnormal; Notable for the following components:   D-Dimer, Quant 1.73 (*)    All other components within normal limits  FIBRINOGEN - Abnormal; Notable for the following components:   Fibrinogen 570 (*)    All other components within normal limits  CULTURE, BLOOD (ROUTINE X 2)  CULTURE, BLOOD (ROUTINE X 2)  CBC  LACTIC ACID, PLASMA  LACTIC ACID, PLASMA  COMPREHENSIVE METABOLIC PANEL  PROCALCITONIN  LACTATE DEHYDROGENASE  FERRITIN  C-REACTIVE PROTEIN   TRIGLYCERIDES  I-STAT BETA HCG BLOOD, ED ([facility], [facility], [facility] ONLY)  POC SARS CORONAVIRUS 2 AG -  ED  TROPONIN I (HIGH SENSITIVITY)  TROPONIN I (HIGH SENSITIVITY)    EKG None  Radiology Dg Chest Portable 1 View  Result Date: 03/07/2019 CLINICAL DATA:  Chest pain, shortness of breath. EXAM: PORTABLE CHEST 1 VIEW COMPARISON:  March 03, 2019. FINDINGS: The heart size and mediastinal contours are within normal limits. No pneumothorax or pleural effusion is noted. Mild ill-defined opacities are noted in the left lingular region and right upper lobe which may represent subsegmental atelectasis or possibly small infiltrates. The visualized skeletal structures are unremarkable. IMPRESSION: Mild ill-defined opacities are noted in the left  lingular region and right upper lobe which may represent subsegmental atelectasis or possibly small infiltrates. Follow-up radiographs are recommended to ensure resolution. Electronically Signed   By: Marijo Conception M.D.   On: 03/07/2019 11:05   Media Information    Document Information  Photos    03/07/2019 11:01  Attached To:  Hospital Encounter on 03/07/19  Source Information  [facility]    Procedures Procedures (including critical care time)  Medications Ordered in ED Medications  sodium chloride flush (NS) 0.9 % injection 3 mL (3 mLs Intravenous Given 03/07/19 1310)     Initial Impression / Assessment and Plan / ED Course  I have reviewed the triage vital signs and the nursing notes.  Pertinent labs & imaging results that were available during my care of the patient were reviewed by me and considered in my medical decision making (see chart for details).  Clinical Course as of Mar 06 1349  Wed Dec 09, 669  4286 53 year old female with history of COVID-19 infection presents with worsening shortness of breath, feeling dizzy, ongoing chest pain and cough with body aches and diarrhea. On exam, patient appears to not be  feeling well, resting O2 sat in the upper 90s however with very brief ambulation sats dropped to 86% on room air.  Patient was placed back in bed and put on 2 L nasal cannula.   [LM]  4825 Chest x-ray with mild ill-defined opacities in the left lingular region and right upper lobe which may represent subsegmental atelectasis or possibly small infiltrates.  Review of labs, CBC and BMP without significant changes, troponin x1 3.  Patient is satting well on nasal cannula.  Plan is to consult hospitalist for admission.   [LM]  0037 Case discussed with hospitalist who will consult for admission.  Plan is to collect rapid Covid test in the ER, will follow with PCR test of rapid is negative.  Case discussed with patient who understands plan of care, feels slightly better on oxygen however still feels unwell today.   [LM]    Clinical Course User Index [LM] Tacy Learn, PA-C      Final Clinical Impressions(s) / ED Diagnoses   Final diagnoses:  COVID-19  Hypoxemia    ED Discharge Orders    None       Tacy Learn, PA-C 03/07/19 1350    Daleen Bo, MD 03/10/19 1113

## 2019-03-07 NOTE — ED Notes (Signed)
IV team at bedside 

## 2019-03-07 NOTE — ED Triage Notes (Signed)
Pt states she is COVID+.  Reports continued fever, pain to center of chest, SOB, and non-productive cough.  Seen in ED on 12/5.

## 2019-03-07 NOTE — ED Notes (Signed)
Pt's covid result was POSITIVE. Informed Mickel Baas - PA.

## 2019-03-07 NOTE — ED Notes (Signed)
Provider at bedside

## 2019-03-07 NOTE — H&P (Signed)
History and Physical    Krystal Reyes JJH:[id number] DOB: 06-13-1965 DOA: 03/07/2019  PCP: Patient, No Pcp Per  Patient coming from: Home  I have personally briefly reviewed patient's old medical records in [facility]  Chief Complaint: Fever and shortness of breath  HPI: Krystal Reyes is a 53 y.o. female with medical history significant of positive COVID-19 test on 03/01/2019 presented to ED with worsening symptoms of shortness of breath and persistent fever.  Reportedly, patient works as a Lawyer at [employer].  She started having some shortness of breath and fever on 02/26/2019.  She was tested positive by her employer on 03/01/2019 and was sent home.  She came to ER on 03/03/2019 due to persistent fever and shortness of breath however since she was not hypoxic so she was discharged home.  Per patient, she has continued to have high-grade fever up to 203.2 as well as shortness of breath so she came to the ER again today.  No other complaint.  She lives with her husband who has been tested negative.  She has no kids.  She has no other complaint.  ED Course: Upon arrival to ED, her blood pressure was on low normal side.  Heart rate and respiratory rate were normal however when she was walked in the ED, she dropped her saturation to 86% on room air requiring 2 L of nasal oxygen.  Hospital service was then consulted to admit the patient for further management.  Review of Systems: As per HPI otherwise negative.    Past Medical History:  Diagnosis Date  . COVID-19   . Degeneration of lumbar intervertebral disc   . Hypothyroidism   . Nodular thyroid disease   . Thoracic spondylosis     Past Surgical History:  Procedure Laterality Date  . ABDOMINAL HYSTERECTOMY    . CESAREAN SECTION    . TONSILLECTOMY       reports that she has never smoked. She has never used smokeless tobacco. She reports that she does not drink alcohol or use drugs.  Allergies  Allergen Reactions  . Albuterol  Palpitations    Pt stated when she first used a Ventolin inhaler it caused tachycardia and she did not feel well afterwards.     Family History  Problem Relation Age of Onset  . Hypertension Mother   . Diabetes Father   . Asthma Father   . Diabetes Sister        x 4 sisters  . Hypertension Sister   . Diabetes Maternal Grandmother   . Asthma Son     Prior to Admission medications   Medication Sig Start Date End Date Taking? Authorizing Provider  acetaminophen (TYLENOL) 500 MG tablet Take 1,000 mg by mouth every 6 (six) hours as needed for moderate pain or fever.   Yes [provider]  albuterol (VENTOLIN HFA) 108 (90 Base) MCG/ACT inhaler Inhale 2 puffs into the lungs every 4 (four) hours as needed for wheezing or shortness of breath. 03/03/19  Yes Henderly, Britni A, PA-C  benzonatate (TESSALON) 200 MG capsule Take 200 mg by mouth 3 (three) times daily as needed. 02/27/19  Yes [provider]  ibuprofen (ADVIL) 200 MG tablet Take 400 mg by mouth every 6 (six) hours as needed for fever or mild pain.   Yes [provider]  loperamide (IMODIUM) 2 MG capsule Take 1 capsule (2 mg total) by mouth 4 (four) times daily as needed for diarrhea or loose stools. 03/03/19  Yes Henderly,  Britni A, PA-C  ondansetron (ZOFRAN ODT) 4 MG disintegrating tablet Take 1 tablet (4 mg total) by mouth every 8 (eight) hours as needed for nausea or vomiting. 03/03/19  Yes Henderly, Britni A, PA-C  benzonatate (TESSALON) 100 MG capsule Take 1 capsule (100 mg total) by mouth every 8 (eight) hours. Patient not taking: Reported on 03/07/2019 03/03/19   Nettie Elm, PA-C    Physical Exam: Vitals:   03/07/19 1215 03/07/19 1230 03/07/19 1245 03/07/19 1315  BP: 112/66 109/68 103/65 109/67  Pulse:   66 66  Resp:   18 (!) 28  Temp:      TempSrc:      SpO2:   100% 100%    Constitutional: NAD, calm, comfortable Vitals:   03/07/19 1215 03/07/19 1230 03/07/19 1245 03/07/19 1315  BP:  112/66 109/68 103/65 109/67  Pulse:   66 66  Resp:   18 (!) 28  Temp:      TempSrc:      SpO2:   100% 100%   Eyes: PERRL, lids and conjunctivae normal ENMT: Mucous membranes are dry. Posterior pharynx clear of any exudate or lesions.Normal dentition.  Neck: normal, supple, no masses, no thyromegaly Respiratory: Scattered rhonchi bilaterally.  No wheezes or crackles. Normal respiratory effort. No accessory muscle use.  Cardiovascular: Regular rate and rhythm, no murmurs / rubs / gallops. No extremity edema. 2+ pedal pulses. No carotid bruits.  Abdomen: no tenderness, no masses palpated. No hepatosplenomegaly. Bowel sounds positive.  Musculoskeletal: no clubbing / cyanosis. No joint deformity upper and lower extremities. Good ROM, no contractures. Normal muscle tone.  Skin: no rashes, lesions, ulcers. No induration Neurologic: CN 2-12 grossly intact. Sensation intact, DTR normal. Strength 5/5 in all 4.  Psychiatric: Normal judgment and insight. Alert and oriented x 3. Normal mood.    Labs on Admission: I have personally reviewed following labs and imaging studies  CBC: Recent Labs  Lab 03/03/19 1541 03/07/19 0940  WBC 4.1 4.2  HGB 14.7 13.5  HCT 46.6* 40.9  MCV 92.3 86.8  PLT 130* 539   Basic Metabolic Panel: Recent Labs  Lab 03/03/19 1752 03/07/19 0940  NA 136 139  K 4.2 4.1  CL 104 105  CO2 24 25  GLUCOSE 98 104*  BUN 9 10  CREATININE 0.86 0.71  CALCIUM 8.6* 9.2   GFR: CrCl cannot be calculated (Unknown ideal weight.). Liver Function Tests: No results for input(s): AST, ALT, ALKPHOS, BILITOT, PROT, ALBUMIN in the last 168 hours. No results for input(s): LIPASE, AMYLASE in the last 168 hours. No results for input(s): AMMONIA in the last 168 hours. Coagulation Profile: No results for input(s): INR, PROTIME in the last 168 hours. Cardiac Enzymes: No results for input(s): CKTOTAL, CKMB, CKMBINDEX, TROPONINI in the last 168 hours. BNP (last 3 results) No results  for input(s): PROBNP in the last 8760 hours. HbA1C: No results for input(s): HGBA1C in the last 72 hours. CBG: No results for input(s): GLUCAP in the last 168 hours. Lipid Profile: No results for input(s): CHOL, HDL, LDLCALC, TRIG, CHOLHDL, LDLDIRECT in the last 72 hours. Thyroid Function Tests: No results for input(s): TSH, T4TOTAL, FREET4, T3FREE, THYROIDAB in the last 72 hours. Anemia Panel: No results for input(s): VITAMINB12, FOLATE, FERRITIN, TIBC, IRON, RETICCTPCT in the last 72 hours. Urine analysis: No results found for: COLORURINE, APPEARANCEUR, [place], [place], GLUCOSEU, HGBUR, BILIRUBINUR, KETONESUR, PROTEINUR, UROBILINOGEN, NITRITE, LEUKOCYTESUR  Radiological Exams on Admission: Dg Chest Portable 1 View  Result Date: 03/07/2019 CLINICAL DATA:  Chest pain, shortness of breath. EXAM: PORTABLE CHEST 1 VIEW COMPARISON:  March 03, 2019. FINDINGS: The heart size and mediastinal contours are within normal limits. No pneumothorax or pleural effusion is noted. Mild ill-defined opacities are noted in the left lingular region and right upper lobe which may represent subsegmental atelectasis or possibly small infiltrates. The visualized skeletal structures are unremarkable. IMPRESSION: Mild ill-defined opacities are noted in the left lingular region and right upper lobe which may represent subsegmental atelectasis or possibly small infiltrates. Follow-up radiographs are recommended to ensure resolution. Electronically Signed   By: Lupita RaiderJames  Green Jr M.D.   On: 03/07/2019 11:05    EKG: No EKG done today  Assessment/Plan Active Problems:   COVID-19 virus infection   Acute respiratory failure with hypoxia (HCC)   Acute hypoxic respiratory failure secondary to COVID-19 pneumonia: Patient is currently requiring 2 L of nasal cannula oxygen.  Chest x-ray shows bilateral minimal infiltrates.  Her D-dimer is elevated at 1.73.  It was also elevated 4 days ago when she came to the ED and it was  1.42.  She had CT angiogram of the chest done on 03/03/2019 and was ruled out of PE but it did confirm groundglass densities bilaterally consistent with viral pneumonia.  I do not think repeat CT angiogram is needed today.  Procalcitonin unremarkable, lactic acid normal.  Rest of the inflammatory markers have been ordered by ED physician and results are pending.  Continue oxygen as needed.  Admit to MedSurg unit.  Consulted pharmacy for remdesivir and will start her on dexamethasone 6 mg p.o. daily for 10 days.  Patient agreeable to this plan of care.  Reportedly, we only have a letter from her employer [facility] certifying that she was tested positive for COVID-19 on 03/01/2019 however ED staff was told by this facility that they do not keep any records of the test that we can confirm with.  Due to this, I have requested ED physician to do rapid test on her in the ED for our records.  I will also started on sliding scale insulin for possible hypoglycemia due to being on steroids.   DVT prophylaxis: Lovenox/SCD Code Status: Full code Family Communication: None present at bedside.  Plan of care discussed with patient in length and he verbalized understanding and agreed with it. Disposition Plan: We will need to be hospitalized for at least 5 days to complete the course of remdesivir. Consults called: None Admission status: Inpatient   Hughie Clossavi Bjorn Hallas MD [facility]  03/07/2019, 2:16 PM  To contact the attending provider between 7A-7P or the covering provider during after hours 7P-7A, please log into the web site [email or web address] and use password [email or web address].

## 2019-03-07 NOTE — ED Notes (Signed)
Blood drawn in triage by staff at [place].

## 2019-03-07 NOTE — ED Notes (Signed)
Pt refusing insulin for CBG.  Pt states she is not a diabetic and questioning the need to check q4 hours.  Attending on call paged

## 2019-03-07 NOTE — ED Notes (Signed)
Pt eating dinner at bedside.

## 2019-03-07 NOTE — ED Notes (Signed)
X-ray at bedside

## 2019-03-08 LAB — CBC WITH DIFFERENTIAL/PLATELET
Abs Immature Granulocytes: 0.05 10*3/uL (ref 0.00–0.07)
Basophils Absolute: 0 10*3/uL (ref 0.0–0.1)
Basophils Relative: 0 %
Eosinophils Absolute: 0 10*3/uL (ref 0.0–0.5)
Eosinophils Relative: 0 %
HCT: 40.4 % (ref 36.0–46.0)
Hemoglobin: 13.1 g/dL (ref 12.0–15.0)
Immature Granulocytes: 1 %
Lymphocytes Relative: 30 %
Lymphs Abs: 1.2 10*3/uL (ref 0.7–4.0)
MCH: 28.9 pg (ref 26.0–34.0)
MCHC: 32.4 g/dL (ref 30.0–36.0)
MCV: 89 fL (ref 80.0–100.0)
Monocytes Absolute: 0.2 10*3/uL (ref 0.1–1.0)
Monocytes Relative: 5 %
Neutro Abs: 2.5 10*3/uL (ref 1.7–7.7)
Neutrophils Relative %: 64 %
Platelets: 215 10*3/uL (ref 150–400)
RBC: 4.54 MIL/uL (ref 3.87–5.11)
RDW: 11.9 % (ref 11.5–15.5)
WBC: 3.9 10*3/uL — ABNORMAL LOW (ref 4.0–10.5)
nRBC: 0 % (ref 0.0–0.2)

## 2019-03-08 LAB — CBG MONITORING, ED
Glucose-Capillary: 114 mg/dL — ABNORMAL HIGH (ref 70–99)
Glucose-Capillary: 104 mg/dL — ABNORMAL HIGH (ref 70–99)
Glucose-Capillary: 144 mg/dL — ABNORMAL HIGH (ref 70–99)
Glucose-Capillary: 113 mg/dL — ABNORMAL HIGH (ref 70–99)

## 2019-03-08 LAB — COMPREHENSIVE METABOLIC PANEL
ALT: 27 U/L (ref 0–44)
AST: 36 U/L (ref 15–41)
Albumin: 2.8 g/dL — ABNORMAL LOW (ref 3.5–5.0)
Alkaline Phosphatase: 34 U/L — ABNORMAL LOW (ref 38–126)
Anion gap: 8 (ref 5–15)
BUN: 10 mg/dL (ref 6–20)
CO2: 27 mmol/L (ref 22–32)
Calcium: 9.1 mg/dL (ref 8.9–10.3)
Chloride: 106 mmol/L (ref 98–111)
Creatinine, Ser: 0.82 mg/dL (ref 0.44–1.00)
GFR calc Af Amer: >60 mL/min (ref >60)
GFR calc non Af Amer: >60 mL/min (ref >60)
Glucose, Bld: 136 mg/dL — ABNORMAL HIGH (ref 70–99)
Potassium: 4.2 mmol/L (ref 3.5–5.1)
Sodium: 141 mmol/L (ref 135–145)
Total Bilirubin: 0.8 mg/dL (ref 0.3–1.2)
Total Protein: 6.7 g/dL (ref 6.5–8.1)

## 2019-03-08 LAB — MAGNESIUM: Magnesium: 2.2 mg/dL (ref 1.7–2.4)

## 2019-03-08 NOTE — ED Notes (Signed)
MS 

## 2019-03-08 NOTE — ED Notes (Signed)
Checked with [place] on bed and was advised that room has not been cleaned yet r/t covid conditions

## 2019-03-08 NOTE — Progress Notes (Signed)
PROGRESS NOTE    Alilah Mcmeans  OZD:[id number] DOB: 06-Oct-1965 DOA: 03/07/2019 PCP: Patient, No Pcp Per   Brief Narrative: Nasha Diss is a 53 y.o. female with no significant history other than recent COVID-19 infection tested on 03/01/2019. Patient presented secondary to continued fevers and shortness of breath and found to have hypoxia. Recent CTA suggests multifocal pneumonia. Started on Remdesivir and Decadron.   Assessment & Plan:   Active Problems:   COVID-19 virus infection   Acute respiratory failure with hypoxia (HCC)   Acute respiratory failure with hypoxia Secondary to below. Requiring 2 L of oxygen via [place] -Continue O2 and keep sat > 92%  COVID-19 pneumonia Patient with symptoms of dyspnea and fever with recent CT scan significant for evidence of multifocal pneumonia. Patient started on Remdesivir and Decadron -Continue Remdesivir and Decadron -Daily CMP, CBC, Ferritin, D-dimer, CRP   DVT prophylaxis: Lovenox Code Status:   Code Status: Full Code Family Communication: None Disposition Plan: Discharge pending completion of Remdesivir and continued improvement of symptoms   Consultants:   None  Procedures:   None  Antimicrobials:  Remdesivir    Subjective: On the commode.  Objective: Vitals:   03/07/19 2130 03/07/19 2145 03/08/19 0629 03/08/19 0700  BP: 108/68 113/67 113/71 110/73  Pulse: 67 67 63 (!) 54  Resp: (!) 29 (!) 34 (!) 21 (!) 23  Temp:   99.1 F (37.3 C)   TempSrc:   Oral   SpO2: 97% 96% 95% 96%    Intake/Output Summary (Last 24 hours) at 03/08/2019 0755 Last data filed at 03/07/2019 1855 Gross per 24 hour  Intake 250 ml  Output 350 ml  Net -100 ml   Filed Weights   03/08/19 1343  Weight: 84.4 kg    Examination:  General exam: Appears calm and comfortable  Respiratory system: Respiratory effort normal. Gastrointestinal system: Abdomen is nondistended Central nervous system: Alert and oriented.. Extremities: No edema. No  calf tenderness Skin: No cyanosis. No rashes Psychiatry: Judgement and insight appear normal. Mood & affect appropriate.     Data Reviewed: I have personally reviewed following labs and imaging studies  CBC: Recent Labs  Lab 03/03/19 1541 03/07/19 0940 03/07/19 1413 03/08/19 0622  WBC 4.1 4.2 4.2 3.9*  NEUTROABS  --   --   --  2.5  HGB 14.7 13.5 12.8 13.1  HCT 46.6* 40.9 39.5 40.4  MCV 92.3 86.8 90.0 89.0  PLT 130* 179 157 215   Basic Metabolic Panel: Recent Labs  Lab 03/03/19 1752 03/07/19 0940 03/07/19 1315 03/07/19 1413 03/08/19 0622  NA 136 139 137  --  141  K 4.2 4.1 3.9  --  4.2  CL 104 105 104  --  106  CO2 24 25 23   --  27  GLUCOSE 98 104* 91  --  136*  BUN 9 10 10   --  10  CREATININE 0.86 0.71 0.78 0.72 0.82  CALCIUM 8.6* 9.2 8.8*  --  9.1  MG  --   --   --   --  2.2   GFR: CrCl cannot be calculated (Unknown ideal weight.). Liver Function Tests: Recent Labs  Lab 03/07/19 1315 03/08/19 0622  AST 35 36  ALT 23 27  ALKPHOS 34* 34*  BILITOT 0.6 0.8  PROT 5.7* 6.7  ALBUMIN 2.8* 2.8*   No results for input(s): LIPASE, AMYLASE in the last 168 hours. No results for input(s): AMMONIA in the last 168 hours. Coagulation Profile: No results for input(s): INR,  PROTIME in the last 168 hours. Cardiac Enzymes: No results for input(s): CKTOTAL, CKMB, CKMBINDEX, TROPONINI in the last 168 hours. BNP (last 3 results) No results for input(s): PROBNP in the last 8760 hours. HbA1C: No results for input(s): HGBA1C in the last 72 hours. CBG: Recent Labs  Lab 03/07/19 1707 03/07/19 2234 03/08/19 0627  GLUCAP 130* 146* 114*   Lipid Profile: Recent Labs    03/07/19 1315  TRIG 61   Thyroid Function Tests: No results for input(s): TSH, T4TOTAL, FREET4, T3FREE, THYROIDAB in the last 72 hours. Anemia Panel: Recent Labs    03/07/19 1315  FERRITIN 194   Sepsis Labs: Recent Labs  Lab 03/07/19 1311 03/07/19 1315 03/07/19 1510  PROCALCITON  --  <0.10   --   LATICACIDVEN 0.8  --  0.8    Recent Results (from the past 240 hour(s))  Blood Culture (routine x 2)     Status: None (Preliminary result)   Collection Time: 03/07/19  1:30 PM   Specimen: BLOOD  Result Value Ref Range Status   Specimen Description BLOOD LEFT ANTECUBITAL  Final   Special Requests   Final    BOTTLES DRAWN AEROBIC AND ANAEROBIC Blood Culture results may not be optimal due to an inadequate volume of blood received in culture bottles   Culture   Final    NO GROWTH <12 HOURS Performed at [facility] [address] [place], [place] [address]    Report Status PENDING  Incomplete         Radiology Studies: DG Chest Portable 1 View  Result Date: 03/07/2019 CLINICAL DATA:  Chest pain, shortness of breath. EXAM: PORTABLE CHEST 1 VIEW COMPARISON:  March 03, 2019. FINDINGS: The heart size and mediastinal contours are within normal limits. No pneumothorax or pleural effusion is noted. Mild ill-defined opacities are noted in the left lingular region and right upper lobe which may represent subsegmental atelectasis or possibly small infiltrates. The visualized skeletal structures are unremarkable. IMPRESSION: Mild ill-defined opacities are noted in the left lingular region and right upper lobe which may represent subsegmental atelectasis or possibly small infiltrates. Follow-up radiographs are recommended to ensure resolution. Electronically Signed   By: Marijo Conception M.D.   On: 03/07/2019 11:05        Scheduled Meds: . albuterol  2 puff Inhalation Q6H  . dexamethasone  6 mg Oral Q24H  . enoxaparin (LOVENOX) injection  40 mg Subcutaneous Q24H  . famotidine  20 mg Oral BID  . insulin aspart  0-9 Units Subcutaneous Q8H  . sodium chloride flush  3 mL Intravenous Q12H   Continuous Infusions: . remdesivir 100 mg in NS 100 mL       LOS: 1 day     Cordelia Poche, MD [facility] 03/08/2019, 7:55 AM  If 7PM-7AM, please contact night-coverage  [email or web address]

## 2019-03-08 NOTE — ED Notes (Signed)
Pt. Refuses inhaler.

## 2019-03-08 NOTE — ED Notes (Signed)
Breakfast Ordered 

## 2019-03-09 LAB — C-REACTIVE PROTEIN: CRP: 6.4 mg/dL — ABNORMAL HIGH (ref <1.0)

## 2019-03-09 LAB — COMPREHENSIVE METABOLIC PANEL
ALT: 33 U/L (ref 0–44)
AST: 39 U/L (ref 15–41)
Albumin: 2.8 g/dL — ABNORMAL LOW (ref 3.5–5.0)
Alkaline Phosphatase: 34 U/L — ABNORMAL LOW (ref 38–126)
Anion gap: 10 (ref 5–15)
BUN: 13 mg/dL (ref 6–20)
CO2: 27 mmol/L (ref 22–32)
Calcium: 9.7 mg/dL (ref 8.9–10.3)
Chloride: 108 mmol/L (ref 98–111)
Creatinine, Ser: 0.8 mg/dL (ref 0.44–1.00)
GFR calc Af Amer: >60 mL/min (ref >60)
GFR calc non Af Amer: >60 mL/min (ref >60)
Glucose, Bld: 137 mg/dL — ABNORMAL HIGH (ref 70–99)
Potassium: 4.9 mmol/L (ref 3.5–5.1)
Sodium: 145 mmol/L (ref 135–145)
Total Bilirubin: 0.4 mg/dL (ref 0.3–1.2)
Total Protein: 6.6 g/dL (ref 6.5–8.1)

## 2019-03-09 LAB — CBC WITH DIFFERENTIAL/PLATELET
Abs Immature Granulocytes: 0.14 10*3/uL — ABNORMAL HIGH (ref 0.00–0.07)
Basophils Absolute: 0 10*3/uL (ref 0.0–0.1)
Basophils Relative: 0 %
Eosinophils Absolute: 0 10*3/uL (ref 0.0–0.5)
Eosinophils Relative: 0 %
HCT: 43.9 % (ref 36.0–46.0)
Hemoglobin: 14.3 g/dL (ref 12.0–15.0)
Immature Granulocytes: 1 %
Lymphocytes Relative: 17 %
Lymphs Abs: 2 10*3/uL (ref 0.7–4.0)
MCH: 28.8 pg (ref 26.0–34.0)
MCHC: 32.6 g/dL (ref 30.0–36.0)
MCV: 88.3 fL (ref 80.0–100.0)
Monocytes Absolute: 0.5 10*3/uL (ref 0.1–1.0)
Monocytes Relative: 4 %
Neutro Abs: 9 10*3/uL — ABNORMAL HIGH (ref 1.7–7.7)
Neutrophils Relative %: 78 %
Platelets: 258 10*3/uL (ref 150–400)
RBC: 4.97 MIL/uL (ref 3.87–5.11)
RDW: 11.8 % (ref 11.5–15.5)
WBC: 11.7 10*3/uL — ABNORMAL HIGH (ref 4.0–10.5)
nRBC: 0 % (ref 0.0–0.2)

## 2019-03-09 LAB — FERRITIN: Ferritin: 337 ng/mL — ABNORMAL HIGH (ref 11–307)

## 2019-03-09 LAB — D-DIMER, QUANTITATIVE: D-Dimer, Quant: 1.38 ug/mL-FEU — ABNORMAL HIGH (ref 0.00–0.50)

## 2019-03-09 LAB — CBG MONITORING, ED
Glucose-Capillary: 112 mg/dL — ABNORMAL HIGH (ref 70–99)
Glucose-Capillary: 118 mg/dL — ABNORMAL HIGH (ref 70–99)
Glucose-Capillary: 132 mg/dL — ABNORMAL HIGH (ref 70–99)

## 2019-03-09 LAB — MAGNESIUM: Magnesium: 2.4 mg/dL (ref 1.7–2.4)

## 2019-03-09 MED ORDER — HYDROXYZINE HCL 25 MG PO TABS: 25.0000 mg | ORAL_TABLET | Freq: Three times a day (TID) | ORAL | Status: DC | PRN

## 2019-03-09 NOTE — Progress Notes (Signed)
PROGRESS NOTE    Krystal Reyes  MVE:[id number] DOB: 10-15-1965 DOA: 03/07/2019 PCP: Patient, No Pcp Per   Brief Narrative: Krystal Reyes is a 53 y.o. female with no significant history other than recent COVID-19 infection tested on 03/01/2019. Patient presented secondary to continued fevers and shortness of breath and found to have hypoxia. Recent CTA suggests multifocal pneumonia. Started on Remdesivir and Decadron.   Assessment & Plan:   Active Problems:   COVID-19 virus infection   Acute respiratory failure with hypoxia (HCC)   Acute respiratory failure with hypoxia Secondary to below. Requiring 2 L of oxygen via [place]. O2 saturations in low 90s (around 92% during interview). Some associated anxiety -Continue O2 and keep sat > 92% -Hydroxyzine prn for anxiety  COVID-19 pneumonia Patient with symptoms of dyspnea and fever with recent CT scan significant for evidence of multifocal pneumonia. CRP of 11.3 on admission, down to 6.4 today. Procalcitonin undetected. Ferritin slightly elevated. Patient started on Remdesivir and Decadron for treatment. -Continue Remdesivir and Decadron -Daily CMP, CBC, Ferritin, D-dimer, CRP  Leukocytosis In setting of steroids. -Monitor   DVT prophylaxis: Lovenox Code Status:   Code Status: Full Code Family Communication: None Disposition Plan: Discharge pending completion of Remdesivir and continued improvement of symptoms   Consultants:   None  Procedures:   None  Antimicrobials:  Remdesivir    Subjective: Some dyspnea but improving. Cough. No chest pain.  Objective: Vitals:   03/09/19 0400 03/09/19 0500 03/09/19 0600 03/09/19 0839  BP: 115/78 113/72 120/76   Pulse: (!) 48 (!) 48 (!) 50   Resp: 20 (!) 22 16   Temp:    98.3 F (36.8 C)  TempSrc:    Oral  SpO2: 95% 94% 97% 97%  Weight:      Height:       No intake or output data in the 24 hours ending 03/09/19 0924 Filed Weights   03/08/19 1343  Weight: 84.4 kg     Examination:  General exam: Appears calm and comfortable Respiratory system: Diminished. Respiratory effort normal. Cardiovascular system: S1 & S2 heard, RRR. No murmurs, rubs, gallops or clicks. Gastrointestinal system: Abdomen is nondistended, soft and nontender. No organomegaly or masses felt. Normal bowel sounds heard. Central nervous system: Alert and oriented. No focal neurological deficits. Extremities: No edema. No calf tenderness Skin: No cyanosis. No rashes Psychiatry: Judgement and insight appear normal. Slightly anxious mood.     Data Reviewed: I have personally reviewed following labs and imaging studies  CBC: Recent Labs  Lab 03/03/19 1541 03/07/19 0940 03/07/19 1413 03/08/19 0622 03/09/19 0407  WBC 4.1 4.2 4.2 3.9* 11.7*  NEUTROABS  --   --   --  2.5 9.0*  HGB 14.7 13.5 12.8 13.1 14.3  HCT 46.6* 40.9 39.5 40.4 43.9  MCV 92.3 86.8 90.0 89.0 88.3  PLT 130* 179 157 215 283   Basic Metabolic Panel: Recent Labs  Lab 03/03/19 1752 03/07/19 0940 03/07/19 1315 03/07/19 1413 03/08/19 0622 03/09/19 0407  NA 136 139 137  --  141 145  K 4.2 4.1 3.9  --  4.2 4.9  CL 104 105 104  --  106 108  CO2 24 25 23   --  27 27  GLUCOSE 98 104* 91  --  136* 137*  BUN 9 10 10   --  10 13  CREATININE 0.86 0.71 0.78 0.72 0.82 0.80  CALCIUM 8.6* 9.2 8.8*  --  9.1 9.7  MG  --   --   --   --  2.2 2.4   GFR: Estimated Creatinine Clearance: 87.9 mL/min (by C-G formula based on SCr of 0.8 mg/dL). Liver Function Tests: Recent Labs  Lab 03/07/19 1315 03/08/19 0622 03/09/19 0407  AST 35 36 39  ALT 23 27 33  ALKPHOS 34* 34* 34*  BILITOT 0.6 0.8 0.4  PROT 5.7* 6.7 6.6  ALBUMIN 2.8* 2.8* 2.8*   No results for input(s): LIPASE, AMYLASE in the last 168 hours. No results for input(s): AMMONIA in the last 168 hours. Coagulation Profile: No results for input(s): INR, PROTIME in the last 168 hours. Cardiac Enzymes: No results for input(s): CKTOTAL, CKMB, CKMBINDEX, TROPONINI  in the last 168 hours. BNP (last 3 results) No results for input(s): PROBNP in the last 8760 hours. HbA1C: No results for input(s): HGBA1C in the last 72 hours. CBG: Recent Labs  Lab 03/08/19 0627 03/08/19 0807 03/08/19 2032 03/08/19 2312 03/09/19 0810  GLUCAP 114* 104* 144* 113* 112*   Lipid Profile: Recent Labs    03/07/19 1315  TRIG 61   Thyroid Function Tests: No results for input(s): TSH, T4TOTAL, FREET4, T3FREE, THYROIDAB in the last 72 hours. Anemia Panel: Recent Labs    03/07/19 1315 03/09/19 0407  FERRITIN 194 337*   Sepsis Labs: Recent Labs  Lab 03/07/19 1311 03/07/19 1315 03/07/19 1510  PROCALCITON  --  <0.10  --   LATICACIDVEN 0.8  --  0.8    Recent Results (from the past 240 hour(s))  Blood Culture (routine x 2)     Status: None (Preliminary result)   Collection Time: 03/07/19  1:30 PM   Specimen: BLOOD  Result Value Ref Range Status   Specimen Description BLOOD LEFT ANTECUBITAL  Final   Special Requests   Final    BOTTLES DRAWN AEROBIC AND ANAEROBIC Blood Culture results may not be optimal due to an inadequate volume of blood received in culture bottles   Culture   Final    NO GROWTH < 24 HOURS Performed at [facility], [address], [place], [place] [address]    Report Status PENDING  Incomplete         Radiology Studies: DG Chest Portable 1 View  Result Date: 03/07/2019 CLINICAL DATA:  Chest pain, shortness of breath. EXAM: PORTABLE CHEST 1 VIEW COMPARISON:  March 03, 2019. FINDINGS: The heart size and mediastinal contours are within normal limits. No pneumothorax or pleural effusion is noted. Mild ill-defined opacities are noted in the left lingular region and right upper lobe which may represent subsegmental atelectasis or possibly small infiltrates. The visualized skeletal structures are unremarkable. IMPRESSION: Mild ill-defined opacities are noted in the left lingular region and right upper lobe which may represent  subsegmental atelectasis or possibly small infiltrates. Follow-up radiographs are recommended to ensure resolution. Electronically Signed   By: Lupita Raider M.D.   On: 03/07/2019 11:05        Scheduled Meds: . albuterol  2 puff Inhalation Q6H  . dexamethasone  6 mg Oral Q24H  . enoxaparin (LOVENOX) injection  40 mg Subcutaneous Q24H  . famotidine  20 mg Oral BID  . insulin aspart  0-9 Units Subcutaneous Q8H  . sodium chloride flush  3 mL Intravenous Q12H   Continuous Infusions: . remdesivir 100 mg in NS 100 mL Stopped (03/08/19 1310)     LOS: 2 days     Jacquelin Hawking, MD [facility] 03/09/2019, 9:24 AM  If 7PM-7AM, please contact night-coverage [email or web address]

## 2019-03-09 NOTE — ED Notes (Signed)
Regular dinner tray ordered 

## 2019-03-09 NOTE — ED Notes (Signed)
Breakfast ordered 

## 2019-03-09 NOTE — ED Notes (Signed)
Pt given toiletries to brush teeth, wash face, etc.

## 2019-03-09 NOTE — ED Notes (Signed)
Lunch Tray Ordered @ 1107. 

## 2019-03-10 DIAGNOSIS — J1289 Other viral pneumonia: Secondary | ICD-10-CM

## 2019-03-10 DIAGNOSIS — D72829 Elevated white blood cell count, unspecified: Secondary | ICD-10-CM

## 2019-03-10 LAB — CBG MONITORING, ED
Glucose-Capillary: 113 mg/dL — ABNORMAL HIGH (ref 70–99)
Glucose-Capillary: 117 mg/dL — ABNORMAL HIGH (ref 70–99)
Glucose-Capillary: 139 mg/dL — ABNORMAL HIGH (ref 70–99)
Glucose-Capillary: 95 mg/dL (ref 70–99)

## 2019-03-10 LAB — COMPREHENSIVE METABOLIC PANEL
ALT: 59 U/L — ABNORMAL HIGH (ref 0–44)
AST: 50 U/L — ABNORMAL HIGH (ref 15–41)
Albumin: 2.6 g/dL — ABNORMAL LOW (ref 3.5–5.0)
Alkaline Phosphatase: 31 U/L — ABNORMAL LOW (ref 38–126)
Anion gap: 8 (ref 5–15)
BUN: 16 mg/dL (ref 6–20)
CO2: 25 mmol/L (ref 22–32)
Calcium: 8.8 mg/dL — ABNORMAL LOW (ref 8.9–10.3)
Chloride: 107 mmol/L (ref 98–111)
Creatinine, Ser: 0.6 mg/dL (ref 0.44–1.00)
GFR calc Af Amer: >60 mL/min (ref >60)
GFR calc non Af Amer: >60 mL/min (ref >60)
Glucose, Bld: 129 mg/dL — ABNORMAL HIGH (ref 70–99)
Potassium: 4.4 mmol/L (ref 3.5–5.1)
Sodium: 140 mmol/L (ref 135–145)
Total Bilirubin: 0.4 mg/dL (ref 0.3–1.2)
Total Protein: 6 g/dL — ABNORMAL LOW (ref 6.5–8.1)

## 2019-03-10 LAB — CBC WITH DIFFERENTIAL/PLATELET
Abs Immature Granulocytes: 0.2 10*3/uL — ABNORMAL HIGH (ref 0.00–0.07)
Basophils Absolute: 0 10*3/uL (ref 0.0–0.1)
Basophils Relative: 0 %
Eosinophils Absolute: 0 10*3/uL (ref 0.0–0.5)
Eosinophils Relative: 0 %
HCT: 39 % (ref 36.0–46.0)
Hemoglobin: 13.2 g/dL (ref 12.0–15.0)
Immature Granulocytes: 2 %
Lymphocytes Relative: 10 %
Lymphs Abs: 1.1 10*3/uL (ref 0.7–4.0)
MCH: 29.4 pg (ref 26.0–34.0)
MCHC: 33.8 g/dL (ref 30.0–36.0)
MCV: 86.9 fL (ref 80.0–100.0)
Monocytes Absolute: 0.5 10*3/uL (ref 0.1–1.0)
Monocytes Relative: 4 %
Neutro Abs: 9.2 10*3/uL — ABNORMAL HIGH (ref 1.7–7.7)
Neutrophils Relative %: 84 %
Platelets: 300 10*3/uL (ref 150–400)
RBC: 4.49 MIL/uL (ref 3.87–5.11)
RDW: 11.9 % (ref 11.5–15.5)
WBC: 11.1 10*3/uL — ABNORMAL HIGH (ref 4.0–10.5)
nRBC: 0 % (ref 0.0–0.2)

## 2019-03-10 LAB — MAGNESIUM: Magnesium: 2.3 mg/dL (ref 1.7–2.4)

## 2019-03-10 LAB — GLUCOSE, CAPILLARY: Glucose-Capillary: 176 mg/dL — ABNORMAL HIGH (ref 70–99)

## 2019-03-10 LAB — FERRITIN: Ferritin: 243 ng/mL (ref 11–307)

## 2019-03-10 LAB — D-DIMER, QUANTITATIVE: D-Dimer, Quant: 1.12 ug/mL-FEU — ABNORMAL HIGH (ref 0.00–0.50)

## 2019-03-10 LAB — C-REACTIVE PROTEIN: CRP: 2.2 mg/dL — ABNORMAL HIGH (ref <1.0)

## 2019-03-10 NOTE — ED Notes (Signed)
Pt up to bedside commode without any problems 

## 2019-03-10 NOTE — ED Notes (Signed)
Pt requesting cough med 

## 2019-03-10 NOTE — Progress Notes (Signed)
PROGRESS NOTE    Krystal Reyes  KNL:[id number] DOB: 05-16-1965 DOA: 03/07/2019 PCP: Patient, No Pcp Per   Brief Narrative: Krystal Reyes is a 53 y.o. female with no significant history other than recent COVID-19 infection tested on 03/01/2019. Patient presented secondary to continued fevers and shortness of breath and found to have hypoxia. Recent CTA suggests multifocal pneumonia. Started on Remdesivir and Decadron.   Assessment & Plan:   Active Problems:   COVID-19 virus infection   Acute respiratory failure with hypoxia (HCC)   Acute respiratory failure with hypoxia Secondary to below. Requiring 2 L of oxygen via [place]. O2 saturations in low 90s (around 92% during interview). Some associated anxiety -Continue O2 and keep sat > 92% -Hydroxyzine prn for anxiety -PT eval  COVID-19 pneumonia Patient with symptoms of dyspnea and fever with recent CT scan significant for evidence of multifocal pneumonia. CRP of 11.3 on admission, down to 2.2 today. Procalcitonin undetected on admission. Ferritin slightly elevated but trended down. Patient started on Remdesivir and Decadron for treatment and is improving. -Continue Remdesivir and Decadron -Daily CMP, CBC, Ferritin, D-dimer, CRP -Tussionex for cough  Leukocytosis In setting of steroids. -Monitor   DVT prophylaxis: Lovenox Code Status:   Code Status: Full Code Family Communication: None Disposition Plan: Discharge tomorrow pending completion of Remdesivir and if weaned to room air   Consultants:   None  Procedures:   None  Antimicrobials:  Remdesivir    Subjective: Cough. Feeling better overall.  Objective: Vitals:   03/10/19 0000 03/10/19 0300 03/10/19 0400 03/10/19 0500  BP: 118/73 114/76 129/75 112/73  Pulse: (!) 43 (!) 43 (!) 44 (!) 51  Resp: 18 15 (!) 23 16  Temp:      TempSrc:      SpO2: 99% 97% 96% 100%  Weight:      Height:       No intake or output data in the 24 hours ending 03/10/19 0910 Filed  Weights   03/08/19 1343  Weight: 84.4 kg    Examination:  General exam: Appears calm and comfortable Respiratory system: Clear to auscultation. Respiratory effort normal. Cardiovascular system: S1 & S2 heard, RRR. No murmurs, rubs, gallops or clicks. Gastrointestinal system: Abdomen is nondistended, soft and nontender. No organomegaly or masses felt. Normal bowel sounds heard. Central nervous system: Alert and oriented. No focal neurological deficits. Extremities: No edema. No calf tenderness Skin: No cyanosis. No rashes Psychiatry: Judgement and insight appear normal. Mood & affect appropriate.     Data Reviewed: I have personally reviewed following labs and imaging studies  CBC: Recent Labs  Lab 03/07/19 0940 03/07/19 1413 03/08/19 0622 03/09/19 0407 03/10/19 0420  WBC 4.2 4.2 3.9* 11.7* 11.1*  NEUTROABS  --   --  2.5 9.0* 9.2*  HGB 13.5 12.8 13.1 14.3 13.2  HCT 40.9 39.5 40.4 43.9 39.0  MCV 86.8 90.0 89.0 88.3 86.9  PLT 179 157 215 258 790   Basic Metabolic Panel: Recent Labs  Lab 03/03/19 1752 03/07/19 0940 03/07/19 1315 03/07/19 1413 03/08/19 0622 03/09/19 0407  NA 136 139 137  --  141 145  K 4.2 4.1 3.9  --  4.2 4.9  CL 104 105 104  --  106 108  CO2 24 25 23   --  27 27  GLUCOSE 98 104* 91  --  136* 137*  BUN 9 10 10   --  10 13  CREATININE 0.86 0.71 0.78 0.72 0.82 0.80  CALCIUM 8.6* 9.2 8.8*  --  9.1 9.7  MG  --   --   --   --  2.2 2.4   GFR: Estimated Creatinine Clearance: 87.9 mL/min (by C-G formula based on SCr of 0.8 mg/dL). Liver Function Tests: Recent Labs  Lab 03/07/19 1315 03/08/19 0622 03/09/19 0407  AST 35 36 39  ALT 23 27 33  ALKPHOS 34* 34* 34*  BILITOT 0.6 0.8 0.4  PROT 5.7* 6.7 6.6  ALBUMIN 2.8* 2.8* 2.8*   No results for input(s): LIPASE, AMYLASE in the last 168 hours. No results for input(s): AMMONIA in the last 168 hours. Coagulation Profile: No results for input(s): INR, PROTIME in the last 168 hours. Cardiac Enzymes: No  results for input(s): CKTOTAL, CKMB, CKMBINDEX, TROPONINI in the last 168 hours. BNP (last 3 results) No results for input(s): PROBNP in the last 8760 hours. HbA1C: No results for input(s): HGBA1C in the last 72 hours. CBG: Recent Labs  Lab 03/09/19 1244 03/09/19 1605 03/09/19 2359 03/10/19 0606 03/10/19 0836  GLUCAP 118* 132* 139* 113* 117*   Lipid Profile: Recent Labs    03/07/19 1315  TRIG 61   Thyroid Function Tests: No results for input(s): TSH, T4TOTAL, FREET4, T3FREE, THYROIDAB in the last 72 hours. Anemia Panel: Recent Labs    03/09/19 0407 03/10/19 0420  FERRITIN 337* 243   Sepsis Labs: Recent Labs  Lab 03/07/19 1311 03/07/19 1315 03/07/19 1510  PROCALCITON  --  <0.10  --   LATICACIDVEN 0.8  --  0.8    Recent Results (from the past 240 hour(s))  Blood Culture (routine x 2)     Status: None (Preliminary result)   Collection Time: 03/07/19  1:30 PM   Specimen: BLOOD  Result Value Ref Range Status   Specimen Description BLOOD LEFT ANTECUBITAL  Final   Special Requests   Final    BOTTLES DRAWN AEROBIC AND ANAEROBIC Blood Culture results may not be optimal due to an inadequate volume of blood received in culture bottles   Culture   Final    NO GROWTH 3 DAYS Performed at [facility], [address] [place], [place] [address]    Report Status PENDING  Incomplete         Radiology Studies: No results found.      Scheduled Meds: . albuterol  2 puff Inhalation Q6H  . dexamethasone  6 mg Oral Q24H  . enoxaparin (LOVENOX) injection  40 mg Subcutaneous Q24H  . famotidine  20 mg Oral BID  . insulin aspart  0-9 Units Subcutaneous Q8H  . sodium chloride flush  3 mL Intravenous Q12H   Continuous Infusions: . remdesivir 100 mg in NS 100 mL Stopped (03/09/19 1416)     LOS: 3 days     Jacquelin Hawking, MD [facility] 03/10/2019, 9:10 AM  If 7PM-7AM, please contact night-coverage [email or web address]

## 2019-03-10 NOTE — ED Notes (Signed)
Pt has had diarrhea X 2 on bedside commode.

## 2019-03-10 NOTE — ED Notes (Signed)
Regular breakfast tray ordered.  

## 2019-03-10 NOTE — ED Notes (Signed)
Report given to 5W RN

## 2019-03-11 LAB — COMPREHENSIVE METABOLIC PANEL
ALT: 54 U/L — ABNORMAL HIGH (ref 0–44)
AST: 36 U/L (ref 15–41)
Albumin: 2.6 g/dL — ABNORMAL LOW (ref 3.5–5.0)
Alkaline Phosphatase: 35 U/L — ABNORMAL LOW (ref 38–126)
Anion gap: 11 (ref 5–15)
BUN: 15 mg/dL (ref 6–20)
CO2: 25 mmol/L (ref 22–32)
Calcium: 8.9 mg/dL (ref 8.9–10.3)
Chloride: 107 mmol/L (ref 98–111)
Creatinine, Ser: 0.62 mg/dL (ref 0.44–1.00)
GFR calc Af Amer: >60 mL/min (ref >60)
GFR calc non Af Amer: >60 mL/min (ref >60)
Glucose, Bld: 131 mg/dL — ABNORMAL HIGH (ref 70–99)
Potassium: 4.5 mmol/L (ref 3.5–5.1)
Sodium: 143 mmol/L (ref 135–145)
Total Bilirubin: 0.3 mg/dL (ref 0.3–1.2)
Total Protein: 5.8 g/dL — ABNORMAL LOW (ref 6.5–8.1)

## 2019-03-11 LAB — CBC WITH DIFFERENTIAL/PLATELET
Abs Immature Granulocytes: 0.31 10*3/uL — ABNORMAL HIGH (ref 0.00–0.07)
Basophils Absolute: 0 10*3/uL (ref 0.0–0.1)
Basophils Relative: 0 %
Eosinophils Absolute: 0 10*3/uL (ref 0.0–0.5)
Eosinophils Relative: 0 %
HCT: 40.4 % (ref 36.0–46.0)
Hemoglobin: 13.3 g/dL (ref 12.0–15.0)
Immature Granulocytes: 3 %
Lymphocytes Relative: 13 %
Lymphs Abs: 1.3 10*3/uL (ref 0.7–4.0)
MCH: 28.7 pg (ref 26.0–34.0)
MCHC: 32.9 g/dL (ref 30.0–36.0)
MCV: 87.3 fL (ref 80.0–100.0)
Monocytes Absolute: 0.6 10*3/uL (ref 0.1–1.0)
Monocytes Relative: 6 %
Neutro Abs: 8.1 10*3/uL — ABNORMAL HIGH (ref 1.7–7.7)
Neutrophils Relative %: 78 %
Platelets: 302 10*3/uL (ref 150–400)
RBC: 4.63 MIL/uL (ref 3.87–5.11)
RDW: 11.8 % (ref 11.5–15.5)
WBC: 10.3 10*3/uL (ref 4.0–10.5)
nRBC: 0 % (ref 0.0–0.2)

## 2019-03-11 LAB — FERRITIN: Ferritin: 188 ng/mL (ref 11–307)

## 2019-03-11 LAB — C-REACTIVE PROTEIN: CRP: 1.3 mg/dL — ABNORMAL HIGH (ref <1.0)

## 2019-03-11 LAB — MAGNESIUM: Magnesium: 2.2 mg/dL (ref 1.7–2.4)

## 2019-03-11 LAB — GLUCOSE, CAPILLARY
Glucose-Capillary: 116 mg/dL — ABNORMAL HIGH (ref 70–99)
Glucose-Capillary: 146 mg/dL — ABNORMAL HIGH (ref 70–99)
Glucose-Capillary: 95 mg/dL (ref 70–99)

## 2019-03-11 LAB — D-DIMER, QUANTITATIVE: D-Dimer, Quant: 1 ug/mL-FEU — ABNORMAL HIGH (ref 0.00–0.50)

## 2019-03-11 MED ORDER — HYDROCOD POLST-CPM POLST ER 10-8 MG/5ML PO SUER: 5.0000 mL | Freq: Two times a day (BID) | ORAL | 0 refills | Status: AC | PRN

## 2019-03-11 MED ORDER — DEXAMETHASONE 6 MG PO TABS: 6.0000 mg | ORAL_TABLET | ORAL | 0 refills | Status: AC

## 2019-03-11 NOTE — Progress Notes (Signed)
Krystal Reyes to be D/C'd Home per MD order.  Discussed with the patient and all questions fully answered.  VSS, Skin clean, dry and intact without evidence of skin break down, no evidence of skin tears noted. IV catheter discontinued intact. Site without signs and symptoms of complications. Dressing and pressure applied.  An After Visit Summary was printed and given to the patient.   D/c education completed with patient including follow up instructions, medication list, d/c activities limitations if indicated, with other d/c instructions as indicated by MD - patient able to verbalize understanding, all questions fully answered.   Patient instructed to return to ED, call 911, or call MD for any changes in condition.   Patient escorted via [place], and D/C home via private auto.  Krystal Reyes 03/11/2019 4:12 PM

## 2019-03-11 NOTE — Discharge Instructions (Signed)
Krystal Reyes,  You were in the hospital with COVID-19 and pneumonia from COVID-19. Please continue your Decadron. Please follow-up with your PCP or the community [facility] if you do not have a PCP. Please return if worsening symptoms. Please quarantine for a total of 21 days from day of your positive test.

## 2019-03-11 NOTE — Discharge Summary (Signed)
Physician Discharge Summary  Chrysa Rampy OEV:[id number] DOB: 1965/10/13 DOA: 03/07/2019  PCP: Patient, No Pcp Per  Admit date: 03/07/2019 Discharge date: 03/11/2019  Admitted From: Home Disposition: Home  Recommendations for Outpatient Follow-up:  1. Follow up with PCP in 1 week 2. Follow up with PCP 3. Please obtain BMP/CBC in one week 4. Quarantine for a total of 21 days from positive test 5. Please follow up on the following pending results: None  Home Health: None Equipment/Devices: None  Discharge Condition: Stable CODE STATUS: Full code Diet recommendation: Heart healthy   Brief/Interim Summary:  Admission HPI written by Hughie Closs, MD   Chief Complaint: Fever and shortness of breath  HPI: Krystal Reyes is a 53 y.o. female with medical history significant of positive COVID-19 test on 03/01/2019 presented to ED with worsening symptoms of shortness of breath and persistent fever.  Reportedly, patient works as a Lawyer at [employer].  She started having some shortness of breath and fever on 02/26/2019.  She was tested positive by her employer on 03/01/2019 and was sent home.  She came to ER on 03/03/2019 due to persistent fever and shortness of breath however since she was not hypoxic so she was discharged home.  Per patient, she has continued to have high-grade fever up to 203.2 as well as shortness of breath so she came to the ER again today.  No other complaint.  She lives with her husband who has been tested negative.  She has no kids.  She has no other complaint.  ED Course: Upon arrival to ED, her blood pressure was on low normal side.  Heart rate and respiratory rate were normal however when she was walked in the ED, she dropped her saturation to 86% on room air requiring 2 L of nasal oxygen.  Hospital service was then consulted to admit the patient for further management.   Hospital course:  Acute respiratory failure with hypoxia Secondary to below. Requiring 2  L of oxygen via [place]. O2 saturations in low 90s (around 92% during interview). Some associated anxiety but she was able to wean to room air. Physical therapy consulted and patient did not require PT follow-up on discharge.  COVID-19 pneumonia Patient with symptoms of dyspnea and fever with recent CT scan significant for evidence of multifocal pneumonia. CRP of 11.3 on admission, down to 1.3 on day of discharge. Procalcitonin undetected on admission. Ferritin slightly elevated but trended down. Patient started on Remdesivir and Decadron for treatment and is improving. Completed 5 days of Remdesivir and discharged on Decadron to complete a 10 day course.  Leukocytosis In setting of steroids. Improved.   Discharge Diagnoses:  Principal Problem:   Pneumonia due to COVID-19 virus Active Problems:   Acute respiratory failure with hypoxia (HCC)   Leukocytosis    Discharge Instructions  Discharge Instructions    Diet - low sodium heart healthy   Complete by: As directed    Increase activity slowly   Complete by: As directed      Allergies as of 03/11/2019      Reactions   Albuterol Palpitations   Pt stated when she first used a Ventolin inhaler it caused tachycardia and she did not feel well afterwards.       Medication List    TAKE these medications   acetaminophen 500 MG tablet Commonly known as: TYLENOL Take 1,000 mg by mouth every 6 (six) hours as needed for moderate pain or fever.   albuterol 108 (90  Base) MCG/ACT inhaler Commonly known as: VENTOLIN HFA Inhale 2 puffs into the lungs every 4 (four) hours as needed for wheezing or shortness of breath.   benzonatate 200 MG capsule Commonly known as: TESSALON Take 200 mg by mouth 3 (three) times daily as needed. What changed: Another medication with the same name was removed. Continue taking this medication, and follow the directions you see here.   chlorpheniramine-HYDROcodone 10-8 MG/5ML Suer Commonly known as:  TUSSIONEX Take 5 mLs by mouth every 12 (twelve) hours as needed for up to 7 days for cough.   dexamethasone 6 MG tablet Commonly known as: DECADRON Take 1 tablet (6 mg total) by mouth daily for 5 days. Start taking on: March 12, 2019   ibuprofen 200 MG tablet Commonly known as: ADVIL Take 400 mg by mouth every 6 (six) hours as needed for fever or mild pain.   loperamide 2 MG capsule Commonly known as: IMODIUM Take 1 capsule (2 mg total) by mouth 4 (four) times daily as needed for diarrhea or loose stools.   ondansetron 4 MG disintegrating tablet Commonly known as: Zofran ODT Take 1 tablet (4 mg total) by mouth every 8 (eight) hours as needed for nausea or vomiting.      Follow-up Information    [facility]. Schedule an appointment as soon as possible for a visit in 1 week(s).   Why: Hospital follow-up. Or follow-up with your own PCP Contact information: [address] [facility] [place] [phone]         Allergies  Allergen Reactions  . Albuterol Palpitations    Pt stated when she first used a Ventolin inhaler it caused tachycardia and she did not feel well afterwards.     Consultations:  None   Procedures/Studies: CT Angio Chest PE W/Cm &/Or Wo Cm  Result Date: 03/03/2019 CLINICAL DATA:  53 year old female with shortness of breath. Positive COVID-19. EXAM: CT ANGIOGRAPHY CHEST WITH CONTRAST TECHNIQUE: Multidetector CT imaging of the chest was performed using the standard protocol during bolus administration of intravenous contrast. Multiplanar CT image reconstructions and MIPs were obtained to evaluate the vascular anatomy. CONTRAST:  63mL OMNIPAQUE IOHEXOL 350 MG/ML SOLN COMPARISON:  Chest radiograph dated 03/03/2019. FINDINGS: Cardiovascular: There is no cardiomegaly or pericardial effusion. The thoracic aorta is unremarkable. Evaluation of the pulmonary arteries is limited due to streak artifact. No  pulmonary artery embolus identified. Mediastinum/Nodes: There is no hilar or mediastinal adenopathy. The esophagus and the thyroid gland are grossly unremarkable as visualized. No mediastinal fluid collection. Lungs/Pleura: Patchy bilateral peripheral and subpleural ground-glass densities consistent with multifocal pneumonia, likely atypical or viral in etiology, including COVID-19. Clinical correlation and follow-up to resolution recommended. There is no pleural effusion or pneumothorax. The central airways are patent. Upper Abdomen: No acute abnormality. Musculoskeletal: No chest wall abnormality. No acute or significant osseous findings. Review of the MIP images confirms the above findings. IMPRESSION: 1. No CT evidence of pulmonary embolism. 2. Bilateral peripheral and subpleural ground-glass densities consistent with multifocal pneumonia, likely atypical or viral in etiology. Clinical correlation and follow-up to resolution recommended. Electronically Signed   By: Elgie CollardArash  Radparvar M.D.   On: 03/03/2019 21:02   DG Chest Portable 1 View  Result Date: 03/07/2019 CLINICAL DATA:  Chest pain, shortness of breath. EXAM: PORTABLE CHEST 1 VIEW COMPARISON:  March 03, 2019. FINDINGS: The heart size and mediastinal contours are within normal limits. No pneumothorax or pleural effusion is noted. Mild ill-defined opacities are noted  in the left lingular region and right upper lobe which may represent subsegmental atelectasis or possibly small infiltrates. The visualized skeletal structures are unremarkable. IMPRESSION: Mild ill-defined opacities are noted in the left lingular region and right upper lobe which may represent subsegmental atelectasis or possibly small infiltrates. Follow-up radiographs are recommended to ensure resolution. Electronically Signed   By: Lupita Raider M.D.   On: 03/07/2019 11:05   DG Chest Portable 1 View  Result Date: 03/03/2019 CLINICAL DATA:  COVID-19 positive, vomiting, diarrhea,  fever, shortness of breath and central chest pain EXAM: PORTABLE CHEST 1 VIEW COMPARISON:  Portable exam 1628 hours compared to 12/26/2017 FINDINGS: Normal heart size, mediastinal contours, and pulmonary vascularity. Lungs clear. No pulmonary infiltrate, pleural effusion or pneumothorax. Osseous structures unremarkable. IMPRESSION: No acute abnormalities. Electronically Signed   By: Ulyses Southward M.D.   On: 03/03/2019 17:12      Subjective: Feels better today. Coughing is still persistent but cough medication and albuterol is helping  Discharge Exam: Vitals:   03/11/19 0527 03/11/19 1206  BP: 121/86 133/67  Pulse: (!) 47 (!) 52  Resp: 13 16  Temp: 98 F (36.7 C) 98.2 F (36.8 C)  SpO2: 99% 99%   Vitals:   03/10/19 1830 03/10/19 2003 03/11/19 0527 03/11/19 1206  BP:  90/74 121/86 133/67  Pulse: (!) 55 (!) 59 (!) 47 (!) 52  Resp: Temp:  98.1 F (36.7 C) 98 F (36.7 C) 98.2 F (36.8 C)  TempSrc:  Oral Oral   SpO2: 95% 99% 99% 99%  Weight:      Height:        General: Pt is alert, awake, not in acute distress Cardiovascular: RRR, S1/S2 +, no rubs, no gallops Respiratory: CTA bilaterally, no wheezing, no rhonchi Abdominal: Soft, NT, ND, bowel sounds + Extremities: no edema, no cyanosis    The results of significant diagnostics from this hospitalization (including imaging, microbiology, ancillary and laboratory) are listed below for reference.     Microbiology: Recent Results (from the past 240 hour(s))  Blood Culture (routine x 2)     Status: None (Preliminary result)   Collection Time: 03/07/19  1:30 PM   Specimen: BLOOD  Result Value Ref Range Status   Specimen Description BLOOD LEFT ANTECUBITAL  Final   Special Requests   Final    BOTTLES DRAWN AEROBIC AND ANAEROBIC Blood Culture results may not be optimal due to an inadequate volume of blood received in culture bottles   Culture   Final    NO GROWTH 4 DAYS Performed at [facility], [address] [place], [place] [address]    Report Status PENDING  Incomplete     Labs: BNP (last 3 results) No results for input(s): BNP in the last 8760 hours. Basic Metabolic Panel: Recent Labs  Lab 03/07/19 1315 03/07/19 1413 03/08/19 0622 03/09/19 0407 03/10/19 0420 03/11/19 0351  NA 137  --  141 145 140 143  K 3.9  --  4.2 4.9 4.4 4.5  CL 104  --  106 108 107 107  CO2 23  --  GLUCOSE 91  --  136* 137* 129* 131*  BUN 10  --  CREATININE 0.78 0.72 0.82 0.80 0.60 0.62  CALCIUM 8.8*  --  9.1 9.7 8.8* 8.9  MG  --   --  2.2 2.4 2.3 2.2   Liver Function Tests: Recent Labs  Lab 03/07/19 1315 03/08/19 0622 03/09/19 0407 03/10/19 0420 03/11/19 0351  AST 35 36 39 50* 36  ALT 23 27 33 59* 54*  ALKPHOS 34* 34* 34* 31* 35*  BILITOT 0.6 0.8 0.4 0.4 0.3  PROT 5.7* 6.7 6.6 6.0* 5.8*  ALBUMIN 2.8* 2.8* 2.8* 2.6* 2.6*   No results for input(s): LIPASE, AMYLASE in the last 168 hours. No results for input(s): AMMONIA in the last 168 hours. CBC: Recent Labs  Lab 03/07/19 1413 03/08/19 0622 03/09/19 0407 03/10/19 0420 03/11/19 0351  WBC 4.2 3.9* 11.7* 11.1* 10.3  NEUTROABS  --  2.5 9.0* 9.2* 8.1*  HGB 12.8 13.1 14.3 13.2 13.3  HCT 39.5 40.4 43.9 39.0 40.4  MCV 90.0 89.0 88.3 86.9 87.3  PLT 157 215 258 300 302   Cardiac Enzymes: No results for input(s): CKTOTAL, CKMB, CKMBINDEX, TROPONINI in the last 168 hours. BNP: Invalid input(s): POCBNP CBG: Recent Labs  Lab 03/10/19 1542 03/10/19 2227 03/11/19 0052 03/11/19 0614 03/11/19 1233  GLUCAP 95 176* 146* 116* 95   D-Dimer Recent Labs    03/10/19 0420 03/11/19 0351  DDIMER 1.12* 1.00*   Hgb A1c No results for input(s): HGBA1C in the last 72 hours. Lipid Profile No results for input(s): CHOL, HDL, LDLCALC, TRIG, CHOLHDL, LDLDIRECT in the last 72 hours. Thyroid function studies No results for input(s): TSH, T4TOTAL, T3FREE, THYROIDAB in the last 72 hours.  Invalid input(s): FREET3 Anemia  work up [employer]    03/10/19 0420 03/11/19 0351  FERRITIN 243 188   Urinalysis No results found for: COLORURINE, APPEARANCEUR, LABSPEC, [place], GLUCOSEU, [place], BILIRUBINUR, [place], PROTEINUR, UROBILINOGEN, NITRITE, LEUKOCYTESUR Sepsis Labs Invalid input(s): PROCALCITONIN,  WBC,  LACTICIDVEN Microbiology Recent Results (from the past 240 hour(s))  Blood Culture (routine x 2)     Status: None (Preliminary result)   Collection Time: 03/07/19  1:30 PM   Specimen: BLOOD  Result Value Ref Range Status   Specimen Description BLOOD LEFT ANTECUBITAL  Final   Special Requests   Final    BOTTLES DRAWN AEROBIC AND ANAEROBIC Blood Culture results may not be optimal due to an inadequate volume of blood received in culture bottles   Culture   Final    NO GROWTH 4 DAYS Performed at [facility], [place] [address], [place], [place] [address]    Report Status PENDING  Incomplete     Time coordinating discharge: 35 minutes  SIGNED:   Cordelia Poche, MD [facility] 03/11/2019, 2:46 PM

## 2019-03-11 NOTE — Progress Notes (Signed)
Pt presented to the floor alert and oriented x 4. Pt was educated about the floor, phone and call bell system. Pt is independent however pt was advised to call before attempting to get out of bed. Call bell, phone, and the pt personal cell phone was placed within the pt reach. Pt's skin was intact, with no issues to report at this time.

## 2019-03-11 NOTE — Progress Notes (Signed)
Physical Therapy Evaluation and Discharge Patient Details Name: Krystal Reyes MRN: [id number] DOB: 09-Aug-1965 Today's Date: 03/11/2019   History of Present Illness  53 y.o. female with medical history significant of positive COVID-19 test on 03/01/2019 presented to ED with worsening symptoms of shortness of breath and persistent fever. Admitted 03/07/19 due to hypoxia due to pna.  Clinical Impression  Patient evaluated by Physical Therapy with no further acute PT needs identified. All education has been completed and the patient has no further questions (see below re: exercise plan on return home). Her saturations remained >95% on room air x 300 ft with ability to turn her head side to side, vary speed up/down, suddenly stop all without imbalance. PT is signing off. Thank you for this referral.     Follow Up Recommendations No PT follow up    Equipment Recommendations  None recommended by PT    Recommendations for Other Services       Precautions / Restrictions Precautions Precautions: None      Mobility  Bed Mobility Overal bed mobility: Independent                Transfers Overall transfer level: Independent                  Ambulation/Gait Ambulation/Gait assistance: Supervision;Independent Gait Distance (Feet): 300 Feet Assistive device: None Gait Pattern/deviations: WFL(Within Functional Limits)   Gait velocity interpretation: >2.62 ft/sec, indicative of community ambulatory General Gait Details: sats 95-97% throughout  Stairs            Wheelchair Mobility    Modified Rankin (Stroke Patients Only)       Balance Overall balance assessment: Independent                                           Pertinent Vitals/Pain Pain Assessment: No/denies pain    Home Living Family/patient expects to be discharged to:: Private residence Living Arrangements: Spouse/significant other;Children Available Help at Discharge:  Family;Available PRN/intermittently(son and husband tested negativ) Type of Home: Apartment Home Access: Stairs to enter Entrance Stairs-Rails: Psychiatric nurse of Steps: flight Home Layout: One level Home Equipment: None      Prior Function Level of Independence: Independent         Comments: 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[place]; 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     Journalist, newspaper        Extremity/Trunk Assessment   Upper Extremity Assessment Upper Extremity Assessment: Overall WFL for tasks assessed    Lower Extremity Assessment Lower Extremity Assessment: Overall WFL for tasks assessed    Cervical / Trunk Assessment Cervical / Trunk Assessment: Normal  Communication   Communication: No difficulties(spanish-speaking, but deferred need for interpreter)  Cognition Arousal/Alertness: Awake/alert Behavior During Therapy: WFL for tasks assessed/performed Overall Cognitive Status: Within Functional Limits for tasks assessed                                        General Comments General comments (skin integrity, edema, etc.): Discussed her exercise routine PTA (she was working out 3x/week for several months--weights, bands with a trainer). Discussed balance between exercising (walking primarily initially) and resting when fatigued. Discussed she will be limited to one room at home initially, but needs to "walk laps" and can  begin some prior exercises without the bands or weights initially    Exercises     Assessment/Plan    PT Assessment Patent does not need any further PT services  PT Problem List         PT Treatment Interventions      PT Goals (Current goals can be found in the Care Plan section)  Acute Rehab PT Goals Patient Stated Goal: go home and rest PT Goal Formulation: All assessment and education complete, DC therapy    Frequency     Barriers to discharge        Co-evaluation               AM-PAC PT "6 Clicks" Mobility   Outcome Measure Help needed turning from your back to your side while in a flat bed without using bedrails?: None Help needed moving from lying on your back to sitting on the side of a flat bed without using bedrails?: None Help needed moving to and from a bed to a chair (including a wheelchair)?: None Help needed standing up from a chair using your arms (e.g., wheelchair or bedside chair)?: None Help needed to walk in hospital room?: None Help needed climbing 3-5 steps with a railing? : None 6 Click Score: 24    End of Session   Activity Tolerance: Patient tolerated treatment well Patient left: in bed;with call bell/phone within reach Nurse Communication: Mobility status;Other (comment)(no f/u PT needs) PT Visit Diagnosis: Other abnormalities of gait and mobility (R26.89)    Time: 3220-2542 PT Time Calculation (min) (ACUTE ONLY): 19 min   Charges:   PT Evaluation $PT Eval Low Complexity: 1 Low           Veda Canning, PT Pager [phone]   Zena Amos 03/11/2019, 2:27 PM

## 2019-03-12 LAB — CULTURE, BLOOD (ROUTINE X 2): Culture: NO GROWTH

## 2019-03-26 ENCOUNTER — Encounter (HOSPITAL_COMMUNITY): Payer: Self-pay | Admitting: Emergency Medicine

## 2019-03-26 ENCOUNTER — Emergency Department (HOSPITAL_COMMUNITY): Payer: HRSA Program

## 2019-03-26 ENCOUNTER — Observation Stay (HOSPITAL_COMMUNITY)
Admission: EM | Admit: 2019-03-26 | Discharge: 2019-03-27 | Disposition: A | Discharge status: Home / Self Care | Payer: HRSA Program | Attending: Internal Medicine | Admitting: Internal Medicine

## 2019-03-26 ENCOUNTER — Other Ambulatory Visit: Payer: Self-pay

## 2019-03-26 DIAGNOSIS — J069 Acute upper respiratory infection, unspecified: Secondary | ICD-10-CM

## 2019-03-26 DIAGNOSIS — I2699 Other pulmonary embolism without acute cor pulmonale: Secondary | ICD-10-CM | POA: Diagnosis present

## 2019-03-26 DIAGNOSIS — I2694 Multiple subsegmental pulmonary emboli without acute cor pulmonale: Secondary | ICD-10-CM | POA: Insufficient documentation

## 2019-03-26 DIAGNOSIS — L258 Unspecified contact dermatitis due to other agents: Secondary | ICD-10-CM | POA: Insufficient documentation

## 2019-03-26 DIAGNOSIS — U071 COVID-19: Principal | ICD-10-CM | POA: Diagnosis present

## 2019-03-26 LAB — CBC WITH DIFFERENTIAL/PLATELET
Abs Immature Granulocytes: 0.03 10*3/uL (ref 0.00–0.07)
Basophils Absolute: 0 10*3/uL (ref 0.0–0.1)
Basophils Relative: 0 %
Eosinophils Absolute: 0.1 10*3/uL (ref 0.0–0.5)
Eosinophils Relative: 1 %
HCT: 37.9 % (ref 36.0–46.0)
Hemoglobin: 12.1 g/dL (ref 12.0–15.0)
Immature Granulocytes: 1 %
Lymphocytes Relative: 35 %
Lymphs Abs: 2 10*3/uL (ref 0.7–4.0)
MCH: 29.4 pg (ref 26.0–34.0)
MCHC: 31.9 g/dL (ref 30.0–36.0)
MCV: 92 fL (ref 80.0–100.0)
Monocytes Absolute: 0.7 10*3/uL (ref 0.1–1.0)
Monocytes Relative: 13 %
Neutro Abs: 2.9 10*3/uL (ref 1.7–7.7)
Neutrophils Relative %: 50 %
Platelets: 229 10*3/uL (ref 150–400)
RBC: 4.12 MIL/uL (ref 3.87–5.11)
RDW: 13.5 % (ref 11.5–15.5)
WBC: 5.7 10*3/uL (ref 4.0–10.5)
nRBC: 0 % (ref 0.0–0.2)

## 2019-03-26 LAB — COMPREHENSIVE METABOLIC PANEL
ALT: 39 U/L (ref 0–44)
AST: 34 U/L (ref 15–41)
Albumin: 2.7 g/dL — ABNORMAL LOW (ref 3.5–5.0)
Alkaline Phosphatase: 40 U/L (ref 38–126)
Anion gap: 9 (ref 5–15)
BUN: 13 mg/dL (ref 6–20)
CO2: 24 mmol/L (ref 22–32)
Calcium: 9.2 mg/dL (ref 8.9–10.3)
Chloride: 110 mmol/L (ref 98–111)
Creatinine, Ser: 0.88 mg/dL (ref 0.44–1.00)
GFR calc Af Amer: >60 mL/min (ref >60)
GFR calc non Af Amer: >60 mL/min (ref >60)
Glucose, Bld: 89 mg/dL (ref 70–99)
Potassium: 4.6 mmol/L (ref 3.5–5.1)
Sodium: 143 mmol/L (ref 135–145)
Total Bilirubin: 0.4 mg/dL (ref 0.3–1.2)
Total Protein: 5.9 g/dL — ABNORMAL LOW (ref 6.5–8.1)

## 2019-03-26 LAB — PROCALCITONIN: Procalcitonin: <0.1 ng/mL

## 2019-03-26 LAB — FERRITIN: Ferritin: 50 ng/mL (ref 11–307)

## 2019-03-26 LAB — LACTIC ACID, PLASMA: Lactic Acid, Venous: 1.5 mmol/L (ref 0.5–1.9)

## 2019-03-26 LAB — D-DIMER, QUANTITATIVE: D-Dimer, Quant: 1.73 ug/mL-FEU — ABNORMAL HIGH (ref 0.00–0.50)

## 2019-03-26 LAB — FIBRINOGEN: Fibrinogen: 550 mg/dL — ABNORMAL HIGH (ref 210–475)

## 2019-03-26 LAB — HEPARIN LEVEL (UNFRACTIONATED): Heparin Unfractionated: 1.05 IU/mL — ABNORMAL HIGH (ref 0.30–0.70)

## 2019-03-26 LAB — TRIGLYCERIDES: Triglycerides: 57 mg/dL (ref <150)

## 2019-03-26 LAB — C-REACTIVE PROTEIN: CRP: 0.7 mg/dL (ref <1.0)

## 2019-03-26 LAB — LACTATE DEHYDROGENASE: LDH: 172 U/L (ref 98–192)

## 2019-03-26 MED ORDER — RIVAROXABAN 20 MG PO TABS: 20.0000 mg | ORAL_TABLET | Freq: Every day | ORAL | Status: DC

## 2019-03-26 MED ORDER — HYDROCOD POLST-CPM POLST ER 10-8 MG/5ML PO SUER: 5.0000 mL | Freq: Two times a day (BID) | ORAL | Status: DC | PRN

## 2019-03-26 MED ORDER — SODIUM CHLORIDE 0.9% FLUSH: 3.0000 mL | Freq: Once | INTRAVENOUS | Status: DC

## 2019-03-26 MED ORDER — MORPHINE SULFATE (PF) 2 MG/ML IV SOLN: 2.0000 mg | INTRAVENOUS | Status: DC | PRN

## 2019-03-26 MED ORDER — HEPARIN (PORCINE) 25000 UT/250ML-% IV SOLN
900.0000 [IU]/h | INTRAVENOUS | Status: DC
  Administered 2019-03-26: 17:00:00 1300 [IU]/h via INTRAVENOUS
  Administered 2019-03-27: 09:00:00 900 [IU]/h via INTRAVENOUS
  Filled 2019-03-26 (×2): qty 250

## 2019-03-26 MED ORDER — BENZONATATE 100 MG PO CAPS
100.0000 mg | ORAL_CAPSULE | Freq: Once | ORAL | Status: AC
  Administered 2019-03-26: 13:00:00 100 mg via ORAL
  Filled 2019-03-26: qty 1

## 2019-03-26 MED ORDER — HEPARIN BOLUS VIA INFUSION
4500.0000 [IU] | Freq: Once | INTRAVENOUS | Status: AC
  Administered 2019-03-26: 17:00:00 4500 [IU] via INTRAVENOUS
  Filled 2019-03-26: qty 4500

## 2019-03-26 MED ORDER — ONDANSETRON HCL 4 MG PO TABS: 4.0000 mg | ORAL_TABLET | Freq: Four times a day (QID) | ORAL | Status: DC | PRN

## 2019-03-26 MED ORDER — ACETAMINOPHEN 325 MG PO TABS: 650.0000 mg | ORAL_TABLET | Freq: Four times a day (QID) | ORAL | Status: DC | PRN

## 2019-03-26 MED ORDER — ALBUTEROL SULFATE HFA 108 (90 BASE) MCG/ACT IN AERS
2.0000 | INHALATION_SPRAY | RESPIRATORY_TRACT | Status: DC | PRN
  Filled 2019-03-26: qty 6.7

## 2019-03-26 MED ORDER — SODIUM CHLORIDE 0.9 % IV SOLN: 250.0000 mL | INTRAVENOUS | Status: DC | PRN

## 2019-03-26 MED ORDER — IOHEXOL 350 MG/ML SOLN
80.0000 mL | Freq: Once | INTRAVENOUS | Status: AC | PRN
  Administered 2019-03-26: 14:00:00 80 mL via INTRAVENOUS

## 2019-03-26 MED ORDER — SODIUM CHLORIDE 0.9% FLUSH: 3.0000 mL | Freq: Two times a day (BID) | INTRAVENOUS | Status: DC

## 2019-03-26 MED ORDER — OXYCODONE HCL 5 MG PO TABS: 5.0000 mg | ORAL_TABLET | ORAL | Status: DC | PRN

## 2019-03-26 MED ORDER — SODIUM CHLORIDE 0.9% FLUSH: 3.0000 mL | INTRAVENOUS | Status: DC | PRN

## 2019-03-26 MED ORDER — ONDANSETRON HCL 4 MG/2ML IJ SOLN: 4.0000 mg | Freq: Four times a day (QID) | INTRAMUSCULAR | Status: DC | PRN

## 2019-03-26 MED ORDER — GUAIFENESIN-DM 100-10 MG/5ML PO SYRP: 10.0000 mL | ORAL_SOLUTION | ORAL | Status: DC | PRN

## 2019-03-26 MED ORDER — LOPERAMIDE HCL 2 MG PO CAPS: 2.0000 mg | ORAL_CAPSULE | Freq: Four times a day (QID) | ORAL | Status: DC | PRN

## 2019-03-26 MED ORDER — RIVAROXABAN 15 MG PO TABS
15.0000 mg | ORAL_TABLET | Freq: Two times a day (BID) | ORAL | Status: DC
  Filled 2019-03-26: qty 1

## 2019-03-26 NOTE — ED Provider Notes (Signed)
[facility] EMERGENCY DEPARTMENT Provider Note   CSN: [id number] Arrival date & time: 03/26/19  7494     History Chief Complaint  Patient presents with  . covid +  . Shortness of Breath    Krystal Reyes is a 53 y.o. female.  The history is provided by the patient and medical records. No language interpreter was used.  Shortness of Breath      53 year old female who test positive for COVID-19 on 03/01/2019 presenting to ED for evaluation of worsening shortness of breath.  Patient reports she was discharged from the hospital 2 weeks ago.  However since being discharged, her symptom has not improved.  She continues to endorse fever, chills, body aches, recurrent nonproductive cough along with pleuritic chest pain and shortness of breath.  Symptoms worse at nighttime and with exertion.  She continues to self isolate and follows recommendation however she notice no improvement.  She did report none nausea vomiting diarrhea has subsided and she denies any loss of taste or smell.  She does not complain of any hemoptysis.  No prior history of PE or DVT.  She have been evaluated twice by her primary care provider for her symptoms through ED visits.  She was recommended by her PCP to come to the ER for further evaluation.  Past Medical History:  Diagnosis Date  . COVID-19   . Degeneration of lumbar intervertebral disc   . Hypothyroidism   . Nodular thyroid disease   . Thoracic spondylosis     Patient Active Problem List   Diagnosis Date Noted  . Leukocytosis 03/10/2019  . Pneumonia due to COVID-19 virus 03/07/2019  . Acute respiratory failure with hypoxia (HCC) 03/07/2019    Past Surgical History:  Procedure Laterality Date  . ABDOMINAL HYSTERECTOMY    . CESAREAN SECTION    . TONSILLECTOMY       OB History   No obstetric history on file.     Family History  Problem Relation Age of Onset  . Hypertension Mother   . Diabetes Father   . Asthma Father   .  Diabetes Sister        x 4 sisters  . Hypertension Sister   . Diabetes Maternal Grandmother   . Asthma Son     Social History   Tobacco Use  . Smoking status: Never Smoker  . Smokeless tobacco: Never Used  Substance Use Topics  . Alcohol use: Never  . Drug use: Never    Home Medications Prior to Admission medications   Medication Sig Start Date End Date Taking? Authorizing Provider  acetaminophen (TYLENOL) 500 MG tablet Take 1,000 mg by mouth every 6 (six) hours as needed for moderate pain or fever.    [provider]  albuterol (VENTOLIN HFA) 108 (90 Base) MCG/ACT inhaler Inhale 2 puffs into the lungs every 4 (four) hours as needed for wheezing or shortness of breath. 03/03/19   Henderly, Britni A, PA-C  benzonatate (TESSALON) 200 MG capsule Take 200 mg by mouth 3 (three) times daily as needed. 02/27/19   [provider]  ibuprofen (ADVIL) 200 MG tablet Take 400 mg by mouth every 6 (six) hours as needed for fever or mild pain.    [provider]  loperamide (IMODIUM) 2 MG capsule Take 1 capsule (2 mg total) by mouth 4 (four) times daily as needed for diarrhea or loose stools. 03/03/19   Henderly, Britni A, PA-C  ondansetron (ZOFRAN ODT) 4 MG disintegrating tablet Take 1  tablet (4 mg total) by mouth every 8 (eight) hours as needed for nausea or vomiting. 03/03/19   Henderly, Britni A, PA-C    Allergies    Albuterol  Review of Systems   Review of Systems  Respiratory: Positive for shortness of breath.   All other systems reviewed and are negative.   Physical Exam Updated Vital Signs BP 113/74 (BP Location: Right Arm)   Pulse 98   Temp 98.7 F (37.1 C) (Oral)   Resp (S) (!) 28   Ht  (1.676 m)   Wt 86.2 kg   SpO2 95%   BMI 30.67 kg/m   Physical Exam Vitals and nursing note reviewed.  Constitutional:      General: She is not in acute distress.    Appearance: She is well-developed.     Comments: Patient appears apprehensive, actively  coughing.  HENT:     Head: Atraumatic.  Eyes:     Conjunctiva/sclera: Conjunctivae normal.  Cardiovascular:     Rate and Rhythm: Normal rate and regular rhythm.  Pulmonary:     Effort: Tachypnea present. No respiratory distress.     Breath sounds: No decreased breath sounds, wheezing, rhonchi or rales.  Abdominal:     Palpations: Abdomen is soft.  Musculoskeletal:     Cervical back: Neck supple.     Right lower leg: No edema.     Left lower leg: No edema.  Skin:    Capillary Refill: Capillary refill takes less than 2 seconds.     Findings: No rash.  Neurological:     Mental Status: She is alert and oriented to person, place, and time.     ED Results / Procedures / Treatments   Labs (all labs ordered are listed, but only abnormal results are displayed) Labs Reviewed  D-DIMER, QUANTITATIVE (NOT AT [facility]) - Abnormal; Notable for the following components:      Result Value   D-Dimer, Quant 1.73 (*)    All other components within normal limits  FIBRINOGEN - Abnormal; Notable for the following components:   Fibrinogen 550 (*)    All other components within normal limits  COMPREHENSIVE METABOLIC PANEL - Abnormal; Notable for the following components:   Total Protein 5.9 (*)    Albumin 2.7 (*)    All other components within normal limits  CULTURE, BLOOD (ROUTINE X 2)  CULTURE, BLOOD (ROUTINE X 2)  LACTIC ACID, PLASMA  CBC WITH DIFFERENTIAL/PLATELET  PROCALCITONIN  LACTATE DEHYDROGENASE  FERRITIN  TRIGLYCERIDES  C-REACTIVE PROTEIN  LACTIC ACID, PLASMA  URINALYSIS, ROUTINE W REFLEX MICROSCOPIC    EKG None   Date: 03/26/2019  Rate: 89  Rhythm: normal sinus rhythm  QRS Axis: normal  Intervals: normal  ST/T Wave abnormalities: normal  Conduction Disutrbances: none  Narrative Interpretation:   Old EKG Reviewed: No significant changes noted     Radiology CT Angio Chest PE W and/or Wo Contrast  Result Date: 03/26/2019 CLINICAL DATA:  Shortness of breath.   COVID-19 positive EXAM: CT ANGIOGRAPHY CHEST WITH CONTRAST TECHNIQUE: Multidetector CT imaging of the chest was performed using the standard protocol during bolus administration of intravenous contrast. Multiplanar CT image reconstructions and MIPs were obtained to evaluate the vascular anatomy. CONTRAST:  80mL OMNIPAQUE IOHEXOL 350 MG/ML SOLN COMPARISON:  CT angiogram chest March 03, 2019; chest radiograph March 26, 2019 FINDINGS: Cardiovascular: There is a focal pulmonary embolus, incompletely obstructing, at the origin of the left lower lobe pulmonary artery. A few small incompletely obstructing pulmonary emboli extend  into left upper lobe pulmonary artery branches. No completely obstructing pulmonary embolus is evident. No evident right heart strain. There is no appreciable thoracic aortic aneurysm or dissection. Visualized great vessels appear unremarkable. There is no pericardial effusion or pericardial thickening. Mediastinum/Nodes: Thyroid appears unremarkable. No thoracic adenopathy is appreciable. No esophageal lesions are evident. Lungs/Pleura: There are small free-flowing pleural effusions. There is patchy airspace opacity in each upper and lower lobe, slightly more notable in the lower lobes than elsewhere. There is overall somewhat less airspace opacity compared to the prior study from 3 weeks prior. Upper Abdomen: Visualized upper abdominal structures appear unremarkable. Musculoskeletal: No blastic or lytic bone lesions. No chest wall lesions are evident. Review of the MIP images confirms the above findings. IMPRESSION: 1. Incompletely obstructing pulmonary emboli on the left at the origin of the left lower lobe pulmonary artery as well as in several left upper lobe pulmonary artery branches. No right heart strain. 2. Patchy infiltrate bilaterally, slightly less than on the study from 3 weeks prior. Small pleural effusions bilaterally. 3.  No evident pleural effusion. Critical Value/emergent  results were called by telephone at the time of interpretation on 03/26/2019 at 1:43 pm to providerBOWIE Tynslee Bowlds , who verbally acknowledged these results. Electronically Signed   By: Lowella Grip III M.D.   On: 03/26/2019 13:43   DG Chest Portable 1 View  Result Date: 03/26/2019 CLINICAL DATA:  Chest pain and fevers. COVID-19 positive. EXAM: PORTABLE CHEST 1 VIEW COMPARISON:  03/07/2019 FINDINGS: The cardiomediastinal silhouette is within normal limits. There are persistent mild ill-defined opacities in the right upper lobe and left mid to lower lung, with overall slight improvement of the left-sided opacities. No new airspace opacity, edema, pleural effusion, pneumothorax is identified. No acute osseous abnormality is seen. IMPRESSION: Slightly improved appearance of the chest with persistent mild ill-defined opacities in the left greater than right lungs which could reflect atypical or viral infection. Electronically Signed   By: Logan Bores M.D.   On: 03/26/2019 09:25    Procedures .Critical Care Performed by: Domenic Moras, PA-C Authorized by: Domenic Moras, PA-C   Critical care provider statement:    Critical care time (minutes):  45   Critical care was time spent personally by me on the following activities:  Discussions with consultants, evaluation of patient's response to treatment, examination of patient, ordering and performing treatments and interventions, ordering and review of laboratory studies, ordering and review of radiographic studies, pulse oximetry, re-evaluation of patient's condition, obtaining history from patient or surrogate and review of old charts   (including critical care time)  Medications Ordered in ED Medications  sodium chloride flush (NS) 0.9 % injection 3 mL (has no administration in time range)  Rivaroxaban (XARELTO) tablet 15 mg (has no administration in time range)  benzonatate (TESSALON) capsule 100 mg (100 mg Oral Given 03/26/19 1248)  iohexol  (OMNIPAQUE) 350 MG/ML injection 80 mL (80 mLs Intravenous Contrast Given 03/26/19 1332)    ED Course  I have reviewed the triage vital signs and the nursing notes.  Pertinent labs & imaging results that were available during my care of the patient were reviewed by me and considered in my medical decision making (see chart for details).    MDM Rules/Calculators/A&P                      BP 116/78   Pulse 73   Temp 98.7 F (37.1 C) (Oral)   Resp 16   Ht  5\' 6"  (1.676 m)   Wt 86.2 kg   SpO2 98%   BMI 30.67 kg/m   Final Clinical Impression(s) / ED Diagnoses Final diagnoses:  Acute respiratory disease due to COVID-19 virus  Multiple subsegmental pulmonary emboli without acute cor pulmonale (HCC)    Rx / DC Orders ED Discharge Orders    None     9:24 AM This is a patient without significant past medical history who has been diagnosed with positive COVID-19 approximately 3 weeks ago requiring hospitalization due to respiratory distress.  She has been out of the hospital for the past 2 weeks, but continues to endorse.  Chest pain and shortness of breath worse at night and worse with any kind of activities.  Patient is currently not hypoxic on room air while at rest however she is coughing profusely and appears uncomfortable.  I am concerned for potential underlying PE that may complicate symptom.  Will obtain chest CT angiogram for further evaluation.  1:49 PM Chest CT angiogram demonstrate incompletely obstructing PE on the left lung without right heart strain.  Patchy infiltrates bilaterally, improved from prior.  Patient's labs are reassuring, no hypoxia, patient prefers to go home.  Will initiate anticoagulants as treatment of her PE.  However pt will benefit from admission.  Care discussed with Dr. Rosalia Hammersay  2:43 PM Appreciate consultation from [facility] Dr. Ophelia CharterYates who agrees to see and admit pt.  Pt currently does not require oxygen.  Xarelto ordered for treatment of her PE.      Aneliese Shawnie DapperLopez was evaluated in Emergency Department on 03/26/2019 for the symptoms described in the history of present illness. She was evaluated in the context of the global COVID-19 pandemic, which necessitated consideration that the patient might be at risk for infection with the SARS-CoV-2 virus that causes COVID-19. Institutional protocols and algorithms that pertain to the evaluation of patients at risk for COVID-19 are in a state of rapid change based on information released by regulatory bodies including the CDC and federal and state organizations. These policies and algorithms were followed during the patient's care in the ED.    Fayrene Helperran, Miasha Emmons, PA-C 03/26/19 1446    Margarita Grizzleay, Danielle, MD 03/26/19 [phone]

## 2019-03-26 NOTE — ED Notes (Signed)
Heparin drip and bolus verified w/Ryan, RN

## 2019-03-26 NOTE — ED Notes (Signed)
Pt is NSR on monitor 

## 2019-03-26 NOTE — Progress Notes (Signed)
ANTICOAGULATION CONSULT NOTE - Initial Consult  Pharmacy Consult for heparin Indication: pulmonary embolus  Allergies  Allergen Reactions  . Albuterol Palpitations    Pt stated when she first used a Ventolin inhaler it caused tachycardia and she did not feel well afterwards.     Patient Measurements: Height: 5\' 6"  (167.6 cm) Weight: 190 lb (86.2 kg) IBW/kg (Calculated) : 59.3 Heparin Dosing Weight: 77.7kg  Vital Signs: Temp: 98.7 F (37.1 C) (12/28 0852) Temp Source: Oral (12/28 0852) BP: 115/87 (12/28 1400) Pulse Rate: 71 (12/28 1400)  Labs: Recent Labs    03/26/19 1055 03/26/19 1056  HGB 12.1  --   HCT 37.9  --   PLT 229  --   CREATININE  --  0.88    Estimated Creatinine Clearance: 80.9 mL/min (by C-G formula based on SCr of 0.88 mg/dL).   Medical History: Past Medical History:  Diagnosis Date  . COVID-19   . Degeneration of lumbar intervertebral disc   . Hypothyroidism   . Nodular thyroid disease   . Thoracic spondylosis     Medications:  Infusions:  . sodium chloride    . heparin      Assessment: 59 yof presented to the ED with worsening SOB. S/p recent hospitalization for COVID-19 infection. Found to have an acute PE and now starting IV heparin. Baseline CBC is WNL. She is not on anticoagulation PTA.   Goal of Therapy:  Heparin level 0.3-0.7 units/ml Monitor platelets by anticoagulation protocol: Yes   Plan:  Heparin bolus 4500 units IV x 1 Heparin gtt 1300 units/hr Check a 6 hr heparin level Daily heparin level and CBC  Krystal Reyes, Rande Lawman 03/26/2019,3:49 PM

## 2019-03-26 NOTE — ED Triage Notes (Signed)
Hx of covid in November with hospitalization -- has increased shortness of breath over past week--- had E-Visit with primary MD- told to come here-- unable to speak in sentences -- fever at home. Afebrile now

## 2019-03-26 NOTE — H&P (Signed)
History and Physical    Krystal Reyes UEA:[id number] DOB: 1965/12/21 DOA: 03/26/2019  PCP: Patient, No Pcp Per Consultants:  None Patient coming from:  Home; NOK: Drucie Ip, [phone]  Chief Complaint: SOB  HPI: Krystal Reyes is a 53 y.o. female with medical history significant of hypothyroidism and COVID-19 disease (positive test on 03/01/19) presenting with SOB x 1 week.  She was last hospitalized from 12/9-13 for COVID-19-associated PNA.  She was treated with Remdesivir and Decadron.  She had some mild improvement in symptoms but generally has had progressive symptoms since last admission.  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  On 11/29, she was infected with COVID-19.  She came to the ER on 12/5 with n/v/d fever and prior documented infection as of 12/3.  She was nontoxic and not septic and so discharged with symptomatic management.  She returned on 12/9 with myalgias, diarrhea, CP, SOB, and dizziness; she was admitted through 12/13.  Since d/c, she has continued to have SOB and chest discomfort, worsening over the last week.  She continues to have a significant cough. At rest, her symptoms are controlled; however, with any exertion she is quite miserable.   ED Course:  Known COVID, increased SOB, has PE.  She is not hypoxic at this time.   Review of Systems: As per HPI; otherwise review of systems reviewed and negative.   Ambulatory Status:  Ambulates without assistance  Past Medical History:  Diagnosis Date  . COVID-19   . Degeneration of lumbar intervertebral disc   . Hypothyroidism   . Nodular thyroid disease   . Thoracic spondylosis     Past Surgical History:  Procedure Laterality Date  . ABDOMINAL HYSTERECTOMY    . CESAREAN SECTION    . TONSILLECTOMY      Social History   Socioeconomic History  . Marital status: Divorced   Spouse name: Not on file  . Number of children: Not on file  . Years of education: Not on file  . Highest education level: Not on file  Occupational History  . Not on file  Tobacco Use  . Smoking status: Never Smoker  . Smokeless tobacco: Never Used  Substance and Sexual Activity  . Alcohol use: Never  . Drug use: Never  . Sexual activity: Not on file  Other Topics Concern  . Not on file  Social History Narrative  . Not on file   Social Determinants of Health   Financial Resource Strain:   . Difficulty of Paying Living Expenses: Not on file  Food Insecurity:   . Worried About Charity fundraiser in the Last Year: Not on file  . Ran Out of Food in the Last Year: Not on file  Transportation Needs:   . Lack of Transportation (Medical): Not on file  . Lack of Transportation (Non-Medical): Not on file  Physical Activity:   . Days of Exercise per Week: Not on file  . Minutes of Exercise per Session: Not on file  Stress:   . Feeling of Stress : Not on file  Social Connections:   . Frequency of Communication with Friends and Family: Not on file  . Frequency of Social Gatherings with Friends and Family: Not on file  . Attends Religious Services: Not on file  . Active Member of Clubs or Organizations: Not on file  .  Attends Banker Meetings: Not on file  . Marital Status: Not on file  Intimate Partner Violence:   . Fear of Current or Ex-Partner: Not on file  . Emotionally Abused: Not on file  . Physically Abused: Not on file  . Sexually Abused: Not on file    Allergies  Allergen Reactions  . Albuterol Palpitations    Pt stated when she first used a Ventolin inhaler it caused tachycardia and she did not feel well afterwards.     Family History  Problem Relation Age of Onset  . Hypertension Mother   . Diabetes Father   . Asthma Father   . Diabetes Sister        x 4 sisters  . Hypertension Sister   . Diabetes Maternal Grandmother   . Asthma Son      Prior to Admission medications   Medication Sig Start Date End Date Taking? Authorizing Provider  acetaminophen (TYLENOL) 500 MG tablet Take 1,000 mg by mouth every 6 (six) hours as needed for moderate pain or fever.   Yes [provider]  albuterol (VENTOLIN HFA) 108 (90 Base) MCG/ACT inhaler Inhale 2 puffs into the lungs every 4 (four) hours as needed for wheezing or shortness of breath. 03/03/19  Yes Henderly, Britni A, PA-C  benzonatate (TESSALON) 200 MG capsule Take 200 mg by mouth 3 (three) times daily as needed for cough.  02/27/19  Yes [provider]  ibuprofen (ADVIL) 200 MG tablet Take 400 mg by mouth every 6 (six) hours as needed for fever or mild pain.   Yes [provider]  loperamide (IMODIUM) 2 MG capsule Take 1 capsule (2 mg total) by mouth 4 (four) times daily as needed for diarrhea or loose stools. 03/03/19   Henderly, Britni A, PA-C  ondansetron (ZOFRAN ODT) 4 MG disintegrating tablet Take 1 tablet (4 mg total) by mouth every 8 (eight) hours as needed for nausea or vomiting. Patient not taking: Reported on 03/26/2019 03/03/19   Linwood Dibbles, PA-C    Physical Exam: Vitals:   03/26/19 1545 03/26/19 1600 03/26/19 1615 03/26/19 1630  BP:  105/74  117/77  Pulse: 65  69 69  Resp: 17 15 11 20   Temp:      TempSrc:      SpO2: 100%  100% 100%  Weight:      Height:         . General:  Appears anxious and uncomfortable, ill-appearing . Eyes:  PERRL, EOMI, normal lids, iris . ENT:  grossly normal hearing, lips & tongue, mmm . Neck:  no LAD, masses or thyromegaly . Cardiovascular:  RRR, no m/r/g. No LE edema.  Respiratory:   CTA bilaterally with no wheezes/rales/rhonchi.  Normal respiratory effort on 2L [place] O2. . Abdomen:  soft, NT, ND, NABS . Back:   normal alignment, no CVAT . Skin:  no rash or induration seen on limited exam . Musculoskeletal:  grossly normal tone BUE/BLE, good ROM, no bony abnormality; negative Homan's/squeeze, symmetric  calves . Psychiatric:  anxious mood and affect, speech fluent and appropriate, AOx3 . Neurologic:  CN 2-12 grossly intact, moves all extremities in coordinated fashion, sensation intact    Radiological Exams on Admission: CT Angio Chest PE W and/or Wo Contrast  Result Date: 03/26/2019 CLINICAL DATA:  Shortness of breath.  COVID-19 positive EXAM: CT ANGIOGRAPHY CHEST WITH CONTRAST TECHNIQUE: Multidetector CT imaging of the chest was performed using the standard protocol during bolus administration of intravenous contrast. Multiplanar  CT image reconstructions and MIPs were obtained to evaluate the vascular anatomy. CONTRAST:  80mL OMNIPAQUE IOHEXOL 350 MG/ML SOLN COMPARISON:  CT angiogram chest March 03, 2019; chest radiograph March 26, 2019 FINDINGS: Cardiovascular: There is a focal pulmonary embolus, incompletely obstructing, at the origin of the left lower lobe pulmonary artery. A few small incompletely obstructing pulmonary emboli extend into left upper lobe pulmonary artery branches. No completely obstructing pulmonary embolus is evident. No evident right heart strain. There is no appreciable thoracic aortic aneurysm or dissection. Visualized great vessels appear unremarkable. There is no pericardial effusion or pericardial thickening. Mediastinum/Nodes: Thyroid appears unremarkable. No thoracic adenopathy is appreciable. No esophageal lesions are evident. Lungs/Pleura: There are small free-flowing pleural effusions. There is patchy airspace opacity in each upper and lower lobe, slightly more notable in the lower lobes than elsewhere. There is overall somewhat less airspace opacity compared to the prior study from 3 weeks prior. Upper Abdomen: Visualized upper abdominal structures appear unremarkable. Musculoskeletal: No blastic or lytic bone lesions. No chest wall lesions are evident. Review of the MIP images confirms the above findings. IMPRESSION: 1. Incompletely obstructing pulmonary emboli on  the left at the origin of the left lower lobe pulmonary artery as well as in several left upper lobe pulmonary artery branches. No right heart strain. 2. Patchy infiltrate bilaterally, slightly less than on the study from 3 weeks prior. Small pleural effusions bilaterally. 3.  No evident pleural effusion. Critical Value/emergent results were called by telephone at the time of interpretation on 03/26/2019 at 1:43 pm to providerBOWIE TRAN , who verbally acknowledged these results. Electronically Signed   By: Bretta BangWilliam  Woodruff III M.D.   On: 03/26/2019 13:43   DG Chest Portable 1 View  Result Date: 03/26/2019 CLINICAL DATA:  Chest pain and fevers. COVID-19 positive. EXAM: PORTABLE CHEST 1 VIEW COMPARISON:  03/07/2019 FINDINGS: The cardiomediastinal silhouette is within normal limits. There are persistent mild ill-defined opacities in the right upper lobe and left mid to lower lung, with overall slight improvement of the left-sided opacities. No new airspace opacity, edema, pleural effusion, pneumothorax is identified. No acute osseous abnormality is seen. IMPRESSION: Slightly improved appearance of the chest with persistent mild ill-defined opacities in the left greater than right lungs which could reflect atypical or viral infection. Electronically Signed   By: Sebastian AcheAllen  Grady M.D.   On: 03/26/2019 09:25    EKG: Independently reviewed.  NSR with rate 89; no evidence of acute ischemia   Labs on Admission: I have personally reviewed the available labs and imaging studies at the time of the admission.  Pertinent labs:   Albumin 2.7 LDH, ferritin, CRP, lactate, procalcitonin normal Normal CBC D-dimer 1.73 Fibrinogen 550   Assessment/Plan Principal Problem:   Pulmonary embolism associated with COVID-19 (HCC)   PE -Patient without prior episodes of thromboembolic disease presenting with new PE -Prior admission for COVID-19 infection -No clinical evidence of DVT on today's exam and this is unlikely  to change current management -She has persistent cough, DOE, pleuritic CP - likely all associated with VTE -She has not had documented hypoxia but was started on [place] O2 for comfort -Will monitor overnight on telemetry -Initiate anticoagulation - for now, will start Heparin drip -She is likely able to transition to alternative [facility] tomorrow, Eliquis if able to afford [facility](TOC consult requested) -She will need 3-6 months of therapy given this COVID-associated VTE event  Recent COVID-19 infection -Patient d/w Dr. Ninetta LightsHatcher -Despite prior test on 12/3, her symptoms have not  yet cleared and so she will remain on airborne/droplet precautions as COVID-positive -No additional treatment appears to be indicated for COVID infection at this time -PE is very likely associated with hypercoagulability in the setting of COVID-19 infection    DVT prophylaxis: Heparin Code Status:  Full - confirmed with patient/family Family Communication: None present; I spoke with her son Blossom Hoops) by telephone in the room with the patient Disposition Plan:  Home once clinically improved Consults called: None  Admission status: It is my clinical opinion that referral for OBSERVATION is reasonable and necessary in this patient based on the above information provided. The aforementioned taken together are felt to place the patient at high risk for further clinical deterioration. However it is anticipated that the patient may be medically stable for discharge from the hospital within 24 to 48 hours.    Jonah Blue MD [facility]   How to contact the [facility] Attending or Consulting provider 7A - 7P or covering provider during after hours 7P -7A, for this patient?  1. Check the care team in [facility] and look for a) attending/consulting [facility] provider listed and b) the [facility] team listed 2. Log into [email or web address] and use [place]'s universal password to access. If you do not have the password, please contact the hospital  operator. 3. Locate the [facility] provider you are looking for under [facility] and page to a number that you can be directly reached. 4. If you still have difficulty reaching the provider, please page the [facility] (Director on Call) for the Hospitalists listed on amion for assistance.   03/26/2019, 5:36 PM

## 2019-03-26 NOTE — Discharge Instructions (Addendum)
Information on my medicine - XARELTO (rivaroxaban)  This medication education was reviewed with me or my healthcare representative as part of my discharge preparation.  WHY WAS XARELTO PRESCRIBED FOR YOU? Xarelto was prescribed to treat blood clots that may have been found in the veins of your legs (deep vein thrombosis) or in your lungs (pulmonary embolism) and to reduce the risk of them occurring again.  What do you need to know about Xarelto? The starting dose is one 15 mg tablet taken TWICE daily with food for the FIRST 21 DAYS then on (enter date)  04/16/2019  the dose is changed to one 20 mg tablet taken ONCE A DAY with your evening meal.  DO NOT stop taking Xarelto without talking to the health care provider who prescribed the medication.  Refill your prescription for 20 mg tablets before you run out.  After discharge, you should have regular check-up appointments with your healthcare provider that is prescribing your Xarelto.  In the future your dose may need to be changed if your kidney function changes by a significant amount.  What do you do if you miss a dose? If you are taking Xarelto TWICE DAILY and you miss a dose, take it as soon as you remember. You may take two 15 mg tablets (total 30 mg) at the same time then resume your regularly scheduled 15 mg twice daily the next day.  If you are taking Xarelto ONCE DAILY and you miss a dose, take it as soon as you remember on the same day then continue your regularly scheduled once daily regimen the next day. Do not take two doses of Xarelto at the same time.   Important Safety Information Xarelto is a blood thinner medicine that can cause bleeding. You should call your healthcare provider right away if you experience any of the following: ? Bleeding from an injury or your nose that does not stop. ? Unusual colored urine (red or dark brown) or unusual colored stools (red or black). ? Unusual bruising for unknown reasons. ? A  serious fall or if you hit your head (even if there is no bleeding).  Some medicines may interact with Xarelto and might increase your risk of bleeding while on Xarelto. To help avoid this, consult your healthcare provider or pharmacist prior to using any new prescription or non-prescription medications, including herbals, vitamins, non-steroidal anti-inflammatory drugs (NSAIDs) and supplements.  This website has more information on Xarelto: [email or web address]

## 2019-03-27 DIAGNOSIS — U071 COVID-19: Secondary | ICD-10-CM

## 2019-03-27 DIAGNOSIS — I2699 Other pulmonary embolism without acute cor pulmonale: Secondary | ICD-10-CM

## 2019-03-27 LAB — COMPREHENSIVE METABOLIC PANEL
ALT: 37 U/L (ref 0–44)
AST: 32 U/L (ref 15–41)
Albumin: 2.6 g/dL — ABNORMAL LOW (ref 3.5–5.0)
Alkaline Phosphatase: 42 U/L (ref 38–126)
Anion gap: 8 (ref 5–15)
BUN: 16 mg/dL (ref 6–20)
CO2: 25 mmol/L (ref 22–32)
Calcium: 8.8 mg/dL — ABNORMAL LOW (ref 8.9–10.3)
Chloride: 107 mmol/L (ref 98–111)
Creatinine, Ser: 0.85 mg/dL (ref 0.44–1.00)
GFR calc Af Amer: >60 mL/min (ref >60)
GFR calc non Af Amer: >60 mL/min (ref >60)
Glucose, Bld: 93 mg/dL (ref 70–99)
Potassium: 4 mmol/L (ref 3.5–5.1)
Sodium: 140 mmol/L (ref 135–145)
Total Bilirubin: 0.5 mg/dL (ref 0.3–1.2)
Total Protein: 5.5 g/dL — ABNORMAL LOW (ref 6.5–8.1)

## 2019-03-27 LAB — CBC
HCT: 36 % (ref 36.0–46.0)
Hemoglobin: 11.5 g/dL — ABNORMAL LOW (ref 12.0–15.0)
MCH: 29.1 pg (ref 26.0–34.0)
MCHC: 31.9 g/dL (ref 30.0–36.0)
MCV: 91.1 fL (ref 80.0–100.0)
Platelets: 204 10*3/uL (ref 150–400)
RBC: 3.95 MIL/uL (ref 3.87–5.11)
RDW: 13.7 % (ref 11.5–15.5)
WBC: 5.4 10*3/uL (ref 4.0–10.5)
nRBC: 0 % (ref 0.0–0.2)

## 2019-03-27 LAB — HEPARIN LEVEL (UNFRACTIONATED)
Heparin Unfractionated: 0.92 IU/mL — ABNORMAL HIGH (ref 0.30–0.70)
Heparin Unfractionated: 0.98 IU/mL — ABNORMAL HIGH (ref 0.30–0.70)

## 2019-03-27 MED ORDER — APIXABAN 5 MG PO TABS: ORAL_TABLET | ORAL | 0 refills | Status: AC

## 2019-03-27 MED ORDER — APIXABAN 5 MG PO TABS: 5.0000 mg | ORAL_TABLET | Freq: Two times a day (BID) | ORAL | 0 refills | Status: AC

## 2019-03-27 MED ORDER — BACITRACIN ZINC 500 UNIT/GM EX OINT
TOPICAL_OINTMENT | Freq: Two times a day (BID) | CUTANEOUS | Status: DC
  Administered 2019-03-27: 1 via TOPICAL
  Filled 2019-03-27: qty 0.9

## 2019-03-27 MED FILL — ELIQUIS STARTER PACK 5 MG T: 5 | 30 days supply | Qty: 74 | Fill #0

## 2019-03-27 NOTE — ED Notes (Signed)
RN consulted with [facility] informed that pt has developed sensitivity to all electrode available. RN unable to tele monitor pt. New orders to be placed for medication.

## 2019-03-27 NOTE — ED Notes (Signed)
A11 form sent to materials

## 2019-03-27 NOTE — Progress Notes (Signed)
[place] for heparin Indication: pulmonary embolus  Allergies  Allergen Reactions  . Albuterol Palpitations    Pt stated when she first used a Ventolin inhaler it caused tachycardia and she did not feel well afterwards.     Patient Measurements: Height: 5\' 6"  (167.6 cm) Weight: 190 lb (86.2 kg) IBW/kg (Calculated) : 59.3 Heparin Dosing Weight: 77.7kg  Vital Signs: BP: 109/82 (12/29 0032) Pulse Rate: 87 (12/29 0032)  Labs: Recent Labs    03/26/19 1055 03/26/19 1056 03/26/19 2209  HGB 12.1  --   --   HCT 37.9  --   --   PLT 229  --   --   HEPARINUNFRC  --   --  1.05*  CREATININE  --  0.88  --     Estimated Creatinine Clearance: 80.9 mL/min (by C-G formula based on SCr of 0.88 mg/dL).   Assessment: 4 yof presented to the ED with worsening SOB. S/p recent hospitalization for COVID-19 infection. Found to have an acute PE and now starting IV heparin. Baseline CBC is WNL. She is not on anticoagulation PTA.   Heparin level 1.05 (supratherapeutic) on gtt at 1300 units/hr. No bleeding noted.  Goal of Therapy:  Heparin level 0.3-0.7 units/ml Monitor platelets by anticoagulation protocol: Yes   Plan:  Decrease heparin to 1100 units/hr F/u 6 hr heparin level  Sherlon Handing, PharmD, BCPS Please see amion for complete clinical pharmacist phone list 03/27/2019,1:36 AM

## 2019-03-27 NOTE — Progress Notes (Signed)
[place] for heparin Indication: pulmonary embolus  Allergies  Allergen Reactions  . Albuterol Palpitations    Pt stated when she first used a Ventolin inhaler it caused tachycardia and she did not feel well afterwards.     Patient Measurements: Height: 5\' 6"  (167.6 cm) Weight: 190 lb (86.2 kg) IBW/kg (Calculated) : 59.3 Heparin Dosing Weight: 77.7kg  Vital Signs: Temp: 98.1 F (36.7 C) (12/29 0613) Temp Source: Oral (12/29 5427) BP: 115/71 (12/29 0623) Pulse Rate: 60 (12/29 0613)  Labs: Recent Labs    03/26/19 1055 03/26/19 1056 03/26/19 2209 03/27/19 0642  HGB 12.1  --   --  11.5*  HCT 37.9  --   --  36.0  PLT 229  --   --  204  HEPARINUNFRC  --   --  1.05* 0.92*  CREATININE  --  0.88  --  0.85    Estimated Creatinine Clearance: 83.7 mL/min (by C-G formula based on SCr of 0.85 mg/dL).   Assessment: 51 yof presented to the ED with worsening SOB. S/p recent hospitalization for COVID-19 infection. Found to have an acute PE and now starting IV heparin. Baseline CBC is WNL. She is not on anticoagulation PTA.   Heparin level supratherapeutic s/p rate decrease, no bleeding noted.    Goal of Therapy:  Heparin level 0.3-0.7 units/ml Monitor platelets by anticoagulation protocol: Yes   Plan:  Decrease heparin gtt to 900 units/hr F/u 6 hour heparin level  Bertis Ruddy, PharmD Clinical Pharmacist Please check AMION for all [place] numbers 03/27/2019 8:38 AM

## 2019-03-27 NOTE — ED Notes (Signed)
ED TO INPATIENT HANDOFF REPORT  ED Nurse Name and Phone #: [phone]  S Name/Age/Gender Krystal Reyes 53 y.o. female Room/Bed: 010C/010C  Code Status   Code Status: Full Code  Home/SNF/Other Home Patient oriented to: self, place, time and situation Is this baseline? Yes   Triage Complete: Triage complete  Chief Complaint Pulmonary embolism associated with COVID-19 (HCC) [U07.1, I26.99]  Triage Note Hx of covid in November with hospitalization -- has increased shortness of breath over past week--- had E-Visit with primary MD- told to come here-- unable to speak in sentences -- fever at home. Afebrile now    Allergies Allergies  Allergen Reactions  . Albuterol Palpitations    Pt stated when she first used a Ventolin inhaler it caused tachycardia and she did not feel well afterwards.     Level of Care/Admitting Diagnosis ED Disposition    ED Disposition Condition Comment   Admit  Hospital Area: [facility] [[id number]]  Level of Care: Telemetry Medical [[id number]]  I expect the patient will be discharged within 24 hours: No (not a candidate for 5C-Observation unit)  Covid Evaluation: Confirmed COVID Positive  Diagnosis: Pulmonary embolism associated with COVID-19 [facility]) [[id number]]  Admitting Physician: Jonah Blue [[id number]]  Attending Physician: Jonah Blue [[id number]]       B Medical/Surgery History Past Medical History:  Diagnosis Date  . COVID-19   . Degeneration of lumbar intervertebral disc   . Hypothyroidism   . Nodular thyroid disease   . Thoracic spondylosis    Past Surgical History:  Procedure Laterality Date  . ABDOMINAL HYSTERECTOMY    . CESAREAN SECTION    . TONSILLECTOMY       A IV Location/Drains/Wounds Patient Lines/Drains/Airways Status   Active Line/Drains/Airways    Name:   Placement date:   Placement time:   Site:   Days:   Peripheral IV 03/07/19 Left Antecubital   03/07/19    1304    Antecubital   20   Peripheral IV  03/26/19 Left;Upper Forearm   03/26/19    1115    Forearm   1          Intake/Output Last 24 hours No intake or output data in the 24 hours ending 03/27/19 0325  Labs/Imaging Results for orders placed or performed during the hospital encounter of 03/26/19 (from the past 48 hour(s))  Triglycerides     Status: None   Collection Time: 03/26/19  9:29 AM  Result Value Ref Range   Triglycerides 57 <150 mg/dL    Comment: Performed at [facility], [address], [place], [place] [address]  CBC with Differential     Status: None   Collection Time: 03/26/19 10:55 AM  Result Value Ref Range   WBC 5.7 4.0 - 10.5 K/uL   RBC 4.12 3.87 - 5.11 MIL/uL   Hemoglobin 12.1 12.0 - 15.0 g/dL   HCT 56.2 13.0 - 86.5 %   MCV 92.0 80.0 - 100.0 fL   MCH 29.4 26.0 - 34.0 pg   MCHC 31.9 30.0 - 36.0 g/dL   RDW 78.4 69.6 - 29.5 %   Platelets 229 150 - 400 K/uL   nRBC 0.0 0.0 - 0.2 %   Neutrophils Relative % 50 %   Neutro Abs 2.9 1.7 - 7.7 K/uL   Lymphocytes Relative 35 %   Lymphs Abs 2.0 0.7 - 4.0 K/uL   Monocytes Relative 13 %   Monocytes Absolute 0.7 0.1 - 1.0 K/uL   Eosinophils Relative  1 %   Eosinophils Absolute 0.1 0.0 - 0.5 K/uL   Basophils Relative 0 %   Basophils Absolute 0.0 0.0 - 0.1 K/uL   Immature Granulocytes 1 %   Abs Immature Granulocytes 0.03 0.00 - 0.07 K/uL    Comment: Performed at [facility], [address], [place], [place] [address]  D-dimer, quantitative     Status: Abnormal   Collection Time: 03/26/19 10:56 AM  Result Value Ref Range   D-Dimer, Quant 1.73 (H) 0.00 - 0.50 ug/mL-FEU    Comment: (NOTE) At the manufacturer cut-off of 0.50 ug/mL FEU, this assay has been documented to exclude PE with a sensitivity and negative predictive value of 97 to 99%.  At this time, this assay has not been approved by the FDA to exclude DVT/VTE. Results should be correlated with clinical presentation. Performed at [facility], [address], [place],  [place] [address]   Procalcitonin     Status: None   Collection Time: 03/26/19 10:56 AM  Result Value Ref Range   Procalcitonin <0.10 ng/mL    Comment:        Interpretation: PCT (Procalcitonin) <= 0.5 ng/mL: Systemic infection (sepsis) is not likely. Local bacterial infection is possible. (NOTE)       Sepsis PCT Algorithm           Lower Respiratory Tract                                      Infection PCT Algorithm    ----------------------------     ----------------------------         PCT < 0.25 ng/mL                PCT < 0.10 ng/mL         Strongly encourage             Strongly discourage   discontinuation of antibiotics    initiation of antibiotics    ----------------------------     -----------------------------       PCT 0.25 - 0.50 ng/mL            PCT 0.10 - 0.25 ng/mL               OR       >80% decrease in PCT            Discourage initiation of                                            antibiotics      Encourage discontinuation           of antibiotics    ----------------------------     -----------------------------         PCT >= 0.50 ng/mL              PCT 0.26 - 0.50 ng/mL               AND        <80% decrease in PCT             Encourage initiation of  antibiotics       Encourage continuation           of antibiotics    ----------------------------     -----------------------------        PCT >= 0.50 ng/mL                  PCT > 0.50 ng/mL               AND         increase in PCT                  Strongly encourage                                      initiation of antibiotics    Strongly encourage escalation           of antibiotics                                     -----------------------------                                           PCT <= 0.25 ng/mL                                                 OR                                        > 80% decrease in PCT                                     Discontinue / Do  not initiate                                             antibiotics Performed at [facility], [address] [place], [place] [address]   Lactate dehydrogenase     Status: None   Collection Time: 03/26/19 10:56 AM  Result Value Ref Range   LDH 172 98 - 192 U/L    Comment: Performed at [facility], [place] [address] [place], [place] [address]  Ferritin     Status: None   Collection Time: 03/26/19 10:56 AM  Result Value Ref Range   Ferritin 50 11 - 307 ng/mL    Comment: Performed at [facility], [place] [address] [place], [place] [address]  Fibrinogen     Status: Abnormal   Collection Time: 03/26/19 10:56 AM  Result Value Ref Range   Fibrinogen 550 (H) 210 - 475 mg/dL    Comment: Performed at [place] [address] [place], [place] [address]  C-reactive protein     Status: None   Collection Time: 03/26/19 10:56 AM  Result Value Ref Range  CRP 0.7 <1.0 mg/dL    Comment: Performed at [facility], [address] [place], [place] [address]  Comprehensive metabolic panel     Status: Abnormal   Collection Time: 03/26/19 10:56 AM  Result Value Ref Range   Sodium 143 135 - 145 mmol/L   Potassium 4.6 3.5 - 5.1 mmol/L   Chloride 110 98 - 111 mmol/L   CO2 24 22 - 32 mmol/L   Glucose, Bld 89 70 - 99 mg/dL   BUN 13 6 - 20 mg/dL   Creatinine, Ser 6.04 0.44 - 1.00 mg/dL   Calcium 9.2 8.9 - 54.0 mg/dL   Total Protein 5.9 (L) 6.5 - 8.1 g/dL   Albumin 2.7 (L) 3.5 - 5.0 g/dL   AST 34 15 - 41 U/L   ALT 39 0 - 44 U/L   Alkaline Phosphatase 40 38 - 126 U/L   Total Bilirubin 0.4 0.3 - 1.2 mg/dL   GFR calc non Af Amer >60 >60 mL/min   GFR calc Af Amer >60 >60 mL/min   Anion gap 9 5 - 15    Comment: Performed at [facility], [address] [place], [place] [address]  Lactic acid, plasma     Status: None   Collection Time: 03/26/19 11:00 AM  Result Value Ref Range   Lactic Acid, Venous 1.5 0.5 - 1.9 mmol/L    Comment: Performed at [facility], [address], [place], [place] [address]  Blood Culture (routine x 2)     Status: None (Preliminary result)   Collection Time: 03/26/19 11:38 AM   Specimen: BLOOD  Result Value Ref Range   Specimen Description BLOOD LEFT ANTECUBITAL    Special Requests      BOTTLES DRAWN AEROBIC ONLY Blood Culture adequate volume   Culture      NO GROWTH < 12 HOURS Performed at [facility], [address], [place], [place] [address]    Report Status PENDING   Heparin level (unfractionated)     Status: Abnormal   Collection Time: 03/26/19 10:09 PM  Result Value Ref Range   Heparin Unfractionated 1.05 (H) 0.30 - 0.70 IU/mL    Comment: (NOTE) If heparin results are below expected values, and patient dosage has  been confirmed, suggest follow up testing of antithrombin III levels. Performed at [facility], [address], [place], [place] [address]    CT Angio Chest PE W and/or Wo Contrast  Result Date: 03/26/2019 CLINICAL DATA:  Shortness of breath.  COVID-19 positive EXAM: CT ANGIOGRAPHY CHEST WITH CONTRAST TECHNIQUE: Multidetector CT imaging of the chest was performed using the standard protocol during bolus administration of intravenous contrast. Multiplanar CT image reconstructions and MIPs were obtained to evaluate the vascular anatomy. CONTRAST:  80mL OMNIPAQUE IOHEXOL 350 MG/ML SOLN COMPARISON:  CT angiogram chest March 03, 2019; chest radiograph March 26, 2019 FINDINGS: Cardiovascular: There is a focal pulmonary embolus, incompletely obstructing, at the origin of the left lower lobe pulmonary artery. A few small incompletely obstructing pulmonary emboli extend into left upper lobe pulmonary artery branches. No completely obstructing pulmonary embolus is evident. No evident right heart strain. There is no appreciable thoracic aortic aneurysm or dissection. Visualized great vessels appear unremarkable. There is no pericardial effusion or pericardial thickening.  Mediastinum/Nodes: Thyroid appears unremarkable. No thoracic adenopathy is appreciable. No esophageal lesions are evident. Lungs/Pleura: There are small free-flowing pleural effusions. There is patchy airspace opacity in each upper and lower  lobe, slightly more notable in the lower lobes than elsewhere. There is overall somewhat less airspace opacity compared to the prior study from 3 weeks prior. Upper Abdomen: Visualized upper abdominal structures appear unremarkable. Musculoskeletal: No blastic or lytic bone lesions. No chest wall lesions are evident. Review of the MIP images confirms the above findings. IMPRESSION: 1. Incompletely obstructing pulmonary emboli on the left at the origin of the left lower lobe pulmonary artery as well as in several left upper lobe pulmonary artery branches. No right heart strain. 2. Patchy infiltrate bilaterally, slightly less than on the study from 3 weeks prior. Small pleural effusions bilaterally. 3.  No evident pleural effusion. Critical Value/emergent results were called by telephone at the time of interpretation on 03/26/2019 at 1:43 pm to providerBOWIE TRAN , who verbally acknowledged these results. Electronically Signed   By: Bretta BangWilliam  Woodruff III M.D.   On: 03/26/2019 13:43   DG Chest Portable 1 View  Result Date: 03/26/2019 CLINICAL DATA:  Chest pain and fevers. COVID-19 positive. EXAM: PORTABLE CHEST 1 VIEW COMPARISON:  03/07/2019 FINDINGS: The cardiomediastinal silhouette is within normal limits. There are persistent mild ill-defined opacities in the right upper lobe and left mid to lower lung, with overall slight improvement of the left-sided opacities. No new airspace opacity, edema, pleural effusion, pneumothorax is identified. No acute osseous abnormality is seen. IMPRESSION: Slightly improved appearance of the chest with persistent mild ill-defined opacities in the left greater than right lungs which could reflect atypical or viral infection. Electronically  Signed   By: Sebastian AcheAllen  Grady M.D.   On: 03/26/2019 09:25    Pending Labs Unresulted Labs (From admission, onward)    Start     Ordered   03/27/19 0800  Heparin level (unfractionated)  Once-Timed,   STAT     03/27/19 0141   03/27/19 0500  CBC  Daily,   R     03/26/19 1536   03/27/19 0500  Comprehensive metabolic panel  Daily,   R     03/26/19 1536   03/27/19 0500  Heparin level (unfractionated)  Daily,   R     03/26/19 1549   03/26/19 0929  Blood Culture (routine x 2)  BLOOD CULTURE X 2,   STAT     03/26/19 0929          Vitals/Pain Today's Vitals   03/26/19 2300 03/27/19 0022 03/27/19 0028 03/27/19 0032  BP: (!) 110/57 (!) 118/91  109/82  Pulse: 76 78  87  Resp: 17 18  20   Temp:      TempSrc:      SpO2: 98% 100%  98%  Weight:      Height:      PainSc:   0-No pain     Isolation Precautions Airborne and Contact precautions  Medications Medications  loperamide (IMODIUM) capsule 2 mg (has no administration in time range)  albuterol (VENTOLIN HFA) 108 (90 Base) MCG/ACT inhaler 2 puff (has no administration in time range)  sodium chloride flush (NS) 0.9 % injection 3 mL (3 mLs Intravenous Not Given 03/26/19 2310)  sodium chloride flush (NS) 0.9 % injection 3 mL (has no administration in time range)  0.9 %  sodium chloride infusion (has no administration in time range)  guaiFENesin-dextromethorphan (ROBITUSSIN DM) 100-10 MG/5ML syrup 10 mL (has no administration in time range)  chlorpheniramine-HYDROcodone (TUSSIONEX) 10-8 MG/5ML suspension 5 mL (has no administration in time range)  acetaminophen (TYLENOL) tablet 650 mg (has no administration in time range)  oxyCODONE (Oxy  IR/ROXICODONE) immediate release tablet 5 mg (has no administration in time range)  ondansetron (ZOFRAN) tablet 4 mg (has no administration in time range)    Or  ondansetron (ZOFRAN) injection 4 mg (has no administration in time range)  morphine 2 MG/ML injection 2 mg (has no administration in time range)   heparin ADULT infusion 100 units/mL (25000 units/229mL sodium chloride 0.45%) (1,100 Units/hr Intravenous Rate/Dose Change 03/27/19 0142)  benzonatate (TESSALON) capsule 100 mg (100 mg Oral Given 03/26/19 1248)  iohexol (OMNIPAQUE) 350 MG/ML injection 80 mL (80 mLs Intravenous Contrast Given 03/26/19 1332)  heparin bolus via infusion 4,500 Units (4,500 Units Intravenous Bolus from Bag 03/26/19 1638)    Mobility walks     Focused Assessments Pulmonary Assessment Handoff:  Lung sounds:   O2 Device: Room Air        R Recommendations: See Admitting Provider Note  Report given to:   Additional Notes: -

## 2019-03-27 NOTE — ED Notes (Signed)
Pt. Tearful. Has refused attempt for second IV. Pt states she is a difficult stick. Second IV is not necessary at this time.

## 2019-03-27 NOTE — ED Notes (Signed)
Pt has developed reaction to electrodes. Blisters and skin peels. Electrodes removed at this time.   RN attempting to call material for hypoallergenic electrodes

## 2019-03-27 NOTE — Discharge Summary (Signed)
Physician Discharge Summary  Krystal Reyes NIO:[id number] DOB: 02-04-1966 DOA: 03/26/2019  PCP: Patient, No Pcp Per  Admit date: 03/26/2019 Discharge date: 03/27/2019   Code Status: Full Code  Admitted From: Home Discharged to: [place]: No Equipment/Devices: None Discharge Condition: Stable  Recommendations for Outpatient Follow-up   1. Discharged with 3 months Eliquis.  1 month from [facility] and will need to follow-up in [facility] pharmacy for assistance with cost of remaining 2 months  Hospital Summary  Per H&P from Dr. Lorin Mercy:  HPI: Krystal Reyes is a 53 y.o. female with medical history significant of hypothyroidism and COVID-19 disease (positive test on 03/01/19) presenting with SOB x 1 week.  She was last hospitalized from 12/9-13 for COVID-19-associated PNA.  She was treated with Remdesivir and Decadron.  She had some mild improvement in symptoms but generally has had progressive symptoms since last admission.  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  On 11/29, she was infected with COVID-19.  She came to the ER on 12/5 with n/v/d fever and prior documented infection as of 12/3.  She was nontoxic and not septic and so discharged with symptomatic management.  She returned on 12/9 with myalgias, diarrhea, CP, SOB, and dizziness; she was admitted through 12/13.  Since d/c, she has continued to have SOB and chest discomfort, worsening over the last week.  She continues to have a significant cough. At rest, her symptoms are controlled; however, with any exertion she is quite miserable.   ED Course:  Known COVID, increased SOB, has PE.  She is not hypoxic at this time.  A & P   Principal Problem:   Pulmonary embolism associated with COVID-19 ([place])   1. Pulmonary embolism suspect secondary to recent COVID-19 diagnosis 1. Remained hemodynamically stable  and tolerating room air 2. CT chest 12/5 without PE, CT chest 12/28: Incompletely obstructing PE on left at origin of LUL pulmonary artery as well as in several LUL pulmonary artery branches without right heart strain 3. Started on heparin drip in ED, discharged on Eliquis 10 mg twice daily x1 week followed by 5 mg twice daily for at least 3 months treatment.   2. Recent COVID-19 infection 1. Despite diagnosis on 12/3, symptoms have not yet cleared and so remained on airborne/droplet precautions inpatient and advised to self isolate at home according to [facility] guidelines 3. Contact dermatitis secondary to EKG leads 1. New onset this hospitalization and tolerated last hospitalization, from COVID-19 infection? 2. Recommended Benadryl cream at discharge if needed    Consultants  . None  Procedures  . None  Antibiotics  None   Subjective  Patient seen and examined at bedside no acute distress and resting comfortably.  No events overnight.  Tolerating diet. In good spirits and anticipating discharge.   Denies any chest pain, shortness of breath, fever, nausea, vomiting, urinary or bowel complaints. Otherwise ROS negative   Objective   Discharge Exam: Vitals:   03/27/19 0400 03/27/19 0613  BP: 111/78 115/71  Pulse: 68 60  Resp:  18  Temp:  98.1 F (36.7 C)  SpO2: 99% 99%   Vitals:   03/27/19 0330 03/27/19 0345 03/27/19 0400 03/27/19 0613  BP: 112/73 123/76 111/78 115/71  Pulse: 69 70 68 60  Resp:  15  18  Temp:    98.1 F (36.7 C)  TempSrc:    Oral  SpO2: 98% 98% 99% 99%  Weight:      Height:        Physical Exam Vitals and nursing note reviewed.  Constitutional:      Appearance: Normal appearance.  HENT:     Head: Normocephalic and atraumatic.     Nose: Nose normal.     Mouth/Throat:     Mouth: Mucous membranes are moist.  Eyes:     Extraocular Movements: Extraocular movements intact.  Cardiovascular:     Rate and Rhythm: Normal rate.     Pulses: Normal  pulses.  Pulmonary:     Effort: Pulmonary effort is normal. No tachypnea.  Abdominal:     General: Abdomen is flat.     Palpations: Abdomen is soft.  Musculoskeletal:        General: No swelling. Normal range of motion.     Cervical back: Normal range of motion. No rigidity.  Skin:    Comments: Circular raised nontender lesions on right anterior chest and under left breast  Neurological:     General: No focal deficit present.     Mental Status: She is alert. Mental status is at baseline.  Psychiatric:        Mood and Affect: Mood normal.        Behavior: Behavior normal.       The results of significant diagnostics from this hospitalization (including imaging, microbiology, ancillary and laboratory) are listed below for reference.     Microbiology: Recent Results (from the past 240 hour(s))  Blood Culture (routine x 2)     Status: None (Preliminary result)   Collection Time: 03/26/19  9:34 AM   Specimen: BLOOD  Result Value Ref Range Status   Specimen Description BLOOD BLOOD LEFT HAND  Final   Special Requests   Final    BOTTLES DRAWN AEROBIC AND ANAEROBIC Blood Culture adequate volume   Culture   Final    NO GROWTH < 24 HOURS Performed at [facility], [address] [place], [place] [address]    Report Status PENDING  Incomplete  Blood Culture (routine x 2)     Status: None (Preliminary result)   Collection Time: 03/26/19 11:38 AM   Specimen: BLOOD  Result Value Ref Range Status   Specimen Description BLOOD LEFT ANTECUBITAL  Final   Special Requests   Final    BOTTLES DRAWN AEROBIC ONLY Blood Culture adequate volume   Culture   Final    NO GROWTH 1 DAY Performed at [facility], [place] [address], [place], [place] [address]    Report Status PENDING  Incomplete     Labs: BNP (last 3 results) No results for input(s): BNP in the last 8760 hours. Basic Metabolic Panel: Recent Labs  Lab 03/26/19 1056 03/27/19 0642  NA 143 140  K 4.6 4.0  CL 110  107  CO2 24 25  GLUCOSE 89 93  BUN 13 16  CREATININE 0.88 0.85  CALCIUM 9.2 8.8*   Liver Function Tests: Recent Labs  Lab 03/26/19 1056 03/27/19 0642  AST 34 32  ALT 39 37  ALKPHOS 40 42  BILITOT 0.4 0.5  PROT 5.9* 5.5*  ALBUMIN 2.7* 2.6*   No results for input(s): LIPASE, AMYLASE in the last 168 hours. No results for input(s): AMMONIA in the last 168 hours. CBC: Recent Labs  Lab 03/26/19 1055 03/27/19 0642  WBC 5.7 5.4  NEUTROABS 2.9  --   HGB 12.1  11.5*  HCT 37.9 36.0  MCV 92.0 91.1  PLT 229 204   Cardiac Enzymes: No results for input(s): CKTOTAL, CKMB, CKMBINDEX, TROPONINI in the last 168 hours. BNP: Invalid input(s): POCBNP CBG: No results for input(s): GLUCAP in the last 168 hours. D-Dimer Recent Labs    03/26/19 1056  DDIMER 1.73*   Hgb A1c No results for input(s): HGBA1C in the last 72 hours. Lipid Profile Recent Labs    03/26/19 0929  TRIG 57   Thyroid function studies No results for input(s): TSH, T4TOTAL, T3FREE, THYROIDAB in the last 72 hours.  Invalid input(s): FREET3 Anemia work up Recent Labs    03/26/19 1056  FERRITIN 50   Urinalysis No results found for: COLORURINE, APPEARANCEUR, [place], [place], [place], [place], [place], [place], [place], UROBILINOGEN, NITRITE, LEUKOCYTESUR Sepsis Labs Invalid input(s): PROCALCITONIN,  WBC,  LACTICIDVEN Microbiology Recent Results (from the past 240 hour(s))  Blood Culture (routine x 2)     Status: None (Preliminary result)   Collection Time: 03/26/19  9:34 AM   Specimen: BLOOD  Result Value Ref Range Status   Specimen Description BLOOD BLOOD LEFT HAND  Final   Special Requests   Final    BOTTLES DRAWN AEROBIC AND ANAEROBIC Blood Culture adequate volume   Culture   Final    NO GROWTH < 24 HOURS Performed at [facility], [address] [place], [place] [address]    Report Status PENDING  Incomplete  Blood Culture (routine x 2)     Status: None (Preliminary result)    Collection Time: 03/26/19 11:38 AM   Specimen: BLOOD  Result Value Ref Range Status   Specimen Description BLOOD LEFT ANTECUBITAL  Final   Special Requests   Final    BOTTLES DRAWN AEROBIC ONLY Blood Culture adequate volume   Culture   Final    NO GROWTH 1 DAY Performed at [facility], [place] [address] [place],  [address]    Report Status PENDING  Incomplete    Discharge Instructions     Discharge Instructions    Diet - low sodium heart healthy   Complete by: As directed    Discharge instructions   Complete by: As directed    You were seen in the hospital for a blood clot in your lungs suspected related to COVID-19.  Upon discharge: -You are being started on a blood thinner called Eliquis.  Take 2 tablets (10 mg) twice daily for 7 days then 1 tablet (5 mg) twice daily for a total of 3 months at least and are notified to stop by a physician  -You are still contagious with COVID-19 and should self isolate at home.  Isolation can be discontinued when the following criteria are met:  At least 10 days have passed since symptoms first appeared AND You are at least 1 day (24 hours) since resolution of fever without the use of any fever reducing medications (Tylenol, Motrin, ibuprofen, etc.) AND There is improvement in your symptoms (ex. shortness of breath, cough, etc.)  If you have any questions do not hesitate to contact your primary care physician or return to the ED if worsening symptoms.   Increase activity slowly   Complete by: As directed    MyChart COVID-19 home monitoring program   Complete by: Mar 27, 2019    Is the patient willing to use the [place] for home monitoring?: Yes   Temperature monitoring   Complete by: Mar 27, 2019    After  how many days would you like to receive a notification of this patient's flowsheet entries?: 1     Allergies as of 03/27/2019      Reactions   Albuterol Palpitations   Pt stated when she first used a Ventolin  inhaler it caused tachycardia and she did not feel well afterwards.       Medication List    TAKE these medications   acetaminophen 500 MG tablet Commonly known as: TYLENOL Take 1,000 mg by mouth every 6 (six) hours as needed for moderate pain or fever.   albuterol 108 (90 Base) MCG/ACT inhaler Commonly known as: VENTOLIN HFA Inhale 2 puffs into the lungs every 4 (four) hours as needed for wheezing or shortness of breath.   apixaban 5 MG Tabs tablet Commonly known as: Eliquis Take 2 tablets (37m) twice daily for 7 days, then 1 tablet (550m twice daily   apixaban 5 MG Tabs tablet Commonly known as: Eliquis Take 1 tablet (5 mg total) by mouth 2 (two) times daily. Start taking on: April 27, 2019   benzonatate 200 MG capsule Commonly known as: TESSALON Take 200 mg by mouth 3 (three) times daily as needed for cough.   ibuprofen 200 MG tablet Commonly known as: ADVIL Take 400 mg by mouth every 6 (six) hours as needed for fever or mild pain.   loperamide 2 MG capsule Commonly known as: IMODIUM Take 1 capsule (2 mg total) by mouth 4 (four) times daily as needed for diarrhea or loose stools.      Follow-up Information    [facility]/2019.   Specialty: Family Medicine Why: Your appointment time is 2:30 pm. Please arrive early and bring a picture ID and your current medications. Contact information: [id number].   Why: Please use this location to fill you prescription medications.. They provide a discount for your meds.  Contact information: [phone]       Allergies  Allergen Reactions  . Albuterol Palpitations    Pt stated when she first used a Ventolin inhaler it caused tachycardia and she did not feel well afterwards.     Time coordinating  discharge: Over 30 minutes   SIGNED:   JaHarold HedgeD.O. [facility] Pager: [phone]/29/2020, 3:33 PM

## 2019-03-27 NOTE — TOC Transition Note (Signed)
Transition of Care [facility]) - CM/SW Discharge Note   Patient Details  Name: Krystal Reyes MRN: [id number] Date of Birth: Jan 10, 1966  Transition of Care [facility]) CM/SW Contact:  Pollie Friar, RN Phone Number: 03/27/2019, 1:43 PM   Clinical Narrative:    Pt discharging home with self care. Pt states she has support at home. CM asked that her medications be sent to [place] and after hospitalization she can use [facility] pharmacy for assistance with the cost of her meds. CM called into the patients room and went over this information. CM was able to get her an appt at [facility] for hospital f/u and information on the AVS. Pt has transportation home when ready.   Final next level of care: Home/Self Care Barriers to Discharge: Inadequate or no insurance, Barriers Unresolved (comment)   Patient Goals and CMS Choice        Discharge Placement                       Discharge Plan and Services                                     Social Determinants of Health (SDOH) Interventions     Readmission Risk Interventions No flowsheet data found.

## 2019-03-27 NOTE — Progress Notes (Signed)
Thurnell Garbe to be D/C'd Home per MD order.  Discussed with the patient and all questions fully answered.  VSS, Skin clean, dry and intact without evidence of skin break down, no evidence of skin tears noted. IV catheter discontinued intact. Site without signs and symptoms of complications. Dressing and pressure applied.  An After Visit Summary was printed and given to the patient. Patient received prescription.  D/c education completed with patient including follow up instructions, medication list, d/c activities limitations if indicated, with other d/c instructions as indicated by MD - patient able to verbalize understanding, all questions fully answered.   Patient instructed to return to ED, call 911, or call MD for any changes in condition.   Patient escorted via [place], and D/C home via private auto.  Jeanella Craze 03/27/2019 4:19 PM

## 2019-03-31 LAB — CULTURE, BLOOD (ROUTINE X 2)
Culture: NO GROWTH
Culture: NO GROWTH
Special Requests: ADEQUATE
Special Requests: ADEQUATE

## 2019-04-06 ENCOUNTER — Other Ambulatory Visit: Payer: Self-pay | Admitting: Internal Medicine

## 2019-04-06 DIAGNOSIS — J069 Acute upper respiratory infection, unspecified: Secondary | ICD-10-CM

## 2019-04-17 ENCOUNTER — Other Ambulatory Visit: Payer: Self-pay

## 2019-04-17 ENCOUNTER — Ambulatory Visit (INDEPENDENT_AMBULATORY_CARE_PROVIDER_SITE_OTHER): Payer: HRSA Program | Admitting: Primary Care

## 2019-04-17 DIAGNOSIS — R0603 Acute respiratory distress: Secondary | ICD-10-CM

## 2019-04-17 DIAGNOSIS — U071 COVID-19: Secondary | ICD-10-CM

## 2019-04-17 DIAGNOSIS — Z7689 Persons encountering health services in other specified circumstances: Secondary | ICD-10-CM

## 2019-04-17 NOTE — Progress Notes (Signed)
Virtual Visit via Telephone Note  I connected with Krystal Reyes on 04/17/19 at  2:30 PM EST by telephone and verified that I am speaking with the correct person using two identifiers.   I discussed the limitations, risks, security and privacy concerns of performing an evaluation and management service by telephone and the availability of in person appointments. I also discussed with the patient that there may be a patient responsible charge related to this service. The patient expressed understanding and agreed to proceed.   History of Present Illness: Krystal Reyes is calling to establish care and hospital follow up. Patient was very short of breath with questions and answers, complaints of difficulty breathing , body aches all over , chills, and cough not feeling much better from hospital discharge Past Medical History:  Diagnosis Date  . COVID-19   . Degeneration of lumbar intervertebral disc   . Hypothyroidism   . Nodular thyroid disease   . Thoracic spondylosis      Current Outpatient Medications on File Prior to Visit  Medication Sig Dispense Refill  . acetaminophen (TYLENOL) 500 MG tablet Take 1,000 mg by mouth every 6 (six) hours as needed for moderate pain or fever.    Marland Kitchen albuterol (VENTOLIN HFA) 108 (90 Base) MCG/ACT inhaler Inhale 2 puffs into the lungs every 4 (four) hours as needed for wheezing or shortness of breath. 8 g 0  . apixaban (ELIQUIS) 5 MG TABS tablet Take 2 tablets (10mg ) twice daily for 7 days, then 1 tablet (5mg ) twice daily 60 tablet 0  . [START ON 04/27/2019] apixaban (ELIQUIS) 5 MG TABS tablet Take 1 tablet (5 mg total) by mouth 2 (two) times daily. 118 tablet 0  . benzonatate (TESSALON) 200 MG capsule Take 200 mg by mouth 3 (three) times daily as needed for cough.     Marland Kitchen ibuprofen (ADVIL) 200 MG tablet Take 400 mg by mouth every 6 (six) hours as needed for fever or mild pain.    Marland Kitchen loperamide (IMODIUM) 2 MG capsule Take 1 capsule (2 mg total) by mouth 4 (four)  times daily as needed for diarrhea or loose stools. 12 capsule 0   No current facility-administered medications on file prior to visit.   Observations/Objective:  Review of Systems  Constitutional: Positive for chills, fever and malaise/fatigue.  Respiratory: Positive for cough and shortness of breath.   Musculoskeletal: Positive for joint pain.  Neurological: Positive for weakness.  All other systems reviewed and are negative.  Assessment and Plan: Diagnoses and all orders for this visit:  Encounter to establish care Juluis Mire, NP-C will be your  (PCP) mastered prepared that is able to that will  diagnosed and treatment able to answer health concern as well as continuing care of varied medical conditions, not limited by cause, organ system, or diagnosis.   Respiratory distress Secondary to positive COVID remains short of breath and not feeling much better encourage to return to the hospital . COVID-19 Patient evaluated, on the phone with the symptoms of chills ,fever, shortness of breath, body aches sent to ED with hesitation but with recent PE and COVID. Must be evaluated today. Patient finally agreed.  Follow Up Instructions:    I discussed the assessment and treatment plan with the patient. The patient was provided an opportunity to ask questions and all were answered. The patient agreed with the plan and demonstrated an understanding of the instructions.   The patient was advised to call back or seek an in-person evaluation if the  symptoms worsen or if the condition fails to improve as anticipated.  I provided 15 minutes of non-face-to-face time during this encounter. Includes reviewing chart , labs and imaging   Grayce Sessions, NP

## 2019-05-07 ENCOUNTER — Telehealth (INDEPENDENT_AMBULATORY_CARE_PROVIDER_SITE_OTHER): Payer: Self-pay

## 2019-05-07 ENCOUNTER — Other Ambulatory Visit (INDEPENDENT_AMBULATORY_CARE_PROVIDER_SITE_OTHER): Payer: Self-pay | Admitting: Primary Care

## 2019-05-07 DIAGNOSIS — J9601 Acute respiratory failure with hypoxia: Secondary | ICD-10-CM

## 2019-05-07 DIAGNOSIS — E038 Other specified hypothyroidism: Secondary | ICD-10-CM

## 2019-05-07 DIAGNOSIS — U071 COVID-19: Secondary | ICD-10-CM

## 2019-05-07 NOTE — Telephone Encounter (Signed)
Schedule her 30 days for labs placed as future than tele or in person  patient preference

## 2019-05-07 NOTE — Telephone Encounter (Signed)
Please advise. Office note does not specify when patient should follow up.

## 2019-05-07 NOTE — Telephone Encounter (Signed)
Patient called wanting to know when is her next follow up with her PCP. Patient states she has been on medication for a month now. Patient would also like to know if PCP would be doing any test or imaging in regards to the Pulmonary embolism she developed while having Covid.   Please advice patient [phone]  Patient needs interpreter.

## 2019-05-07 NOTE — Telephone Encounter (Signed)
Please clarify

## 2019-05-08 NOTE — Telephone Encounter (Signed)
She needs a TSH/T4 in 6 weeks orders are placed. Follow up can be tele if labs are completed first

## 2019-05-08 NOTE — Telephone Encounter (Signed)
Patient scheduled an appointment for lab work.

## 2019-05-08 NOTE — Telephone Encounter (Signed)
Would please call patient and schedule lab visit in 6 weeks. Thank you.

## 2019-06-05 ENCOUNTER — Other Ambulatory Visit (INDEPENDENT_AMBULATORY_CARE_PROVIDER_SITE_OTHER): Payer: Self-pay

## 2019-06-06 ENCOUNTER — Ambulatory Visit: Payer: Self-pay

## 2021-02-07 IMAGING — CT CT ANGIO CHEST
2 of 6 series · 18 of 46 positions shown · IV contrast (omnipaque)
Comparison: CT angiogram chest March 03, 2019; chest radiograph
March 26, 2019

CLINICAL DATA: Shortness of breath.  [id number]-PM positive

EXAM:
CT ANGIOGRAPHY CHEST WITH CONTRAST
TECHNIQUE: Multidetector CT imaging of the chest was performed using the
standard protocol during bolus administration of intravenous
contrast. Multiplanar CT image reconstructions and MIPs were
obtained to evaluate the vascular anatomy.
CONTRAST:  80mL OMNIPAQUE IOHEXOL 350 MG/ML SOLN

[Series 7: thins · axial · 0.66mm/px · z∈[+1088,+1330]mm · 15 of 266 slices shown]
[im 12/266  lung]
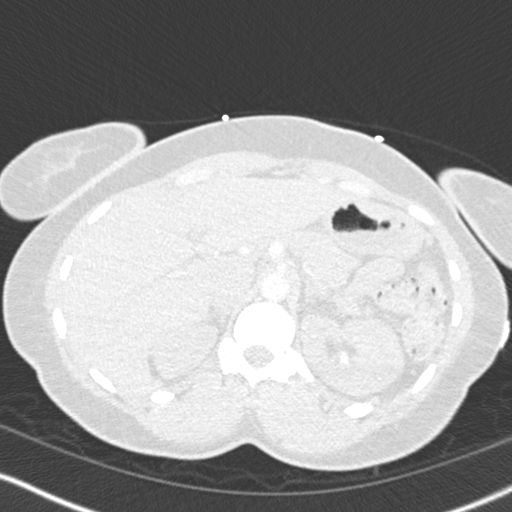
[im 35/266  soft-tissue]
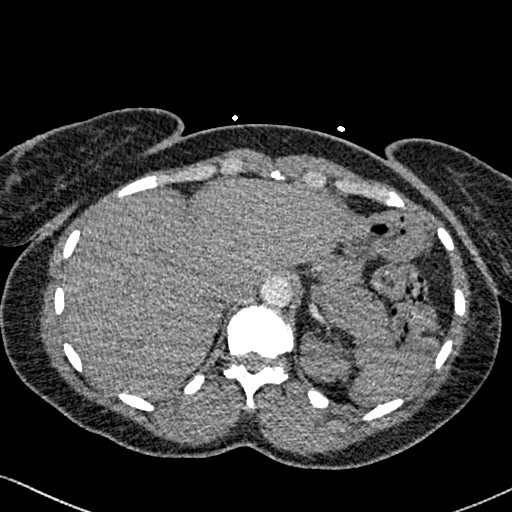
[im 47/266  lung]
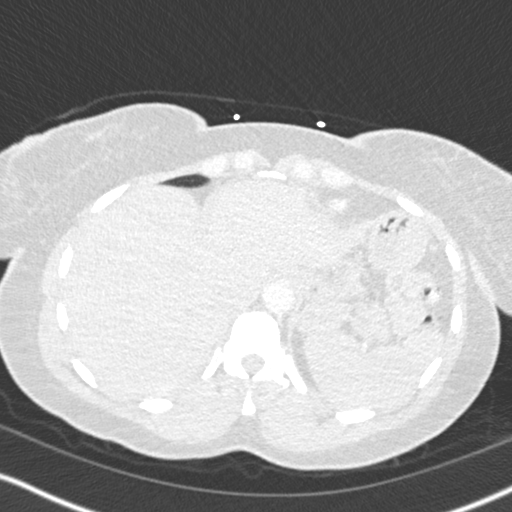
[im 70/266  soft-tissue]
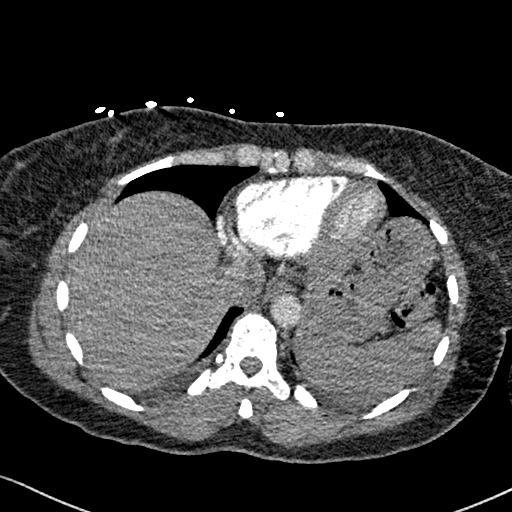
[im 81/266  lung]
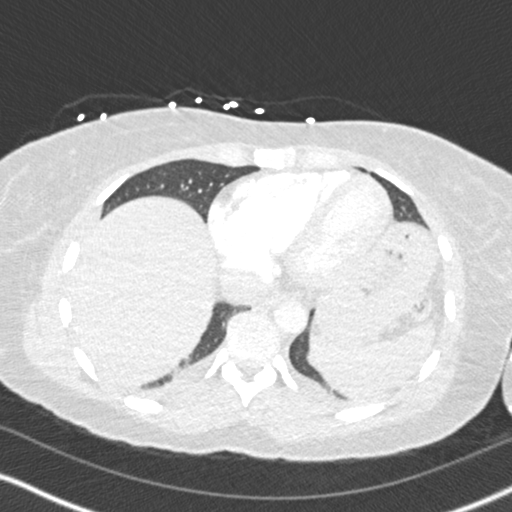
[im 104/266  soft-tissue]
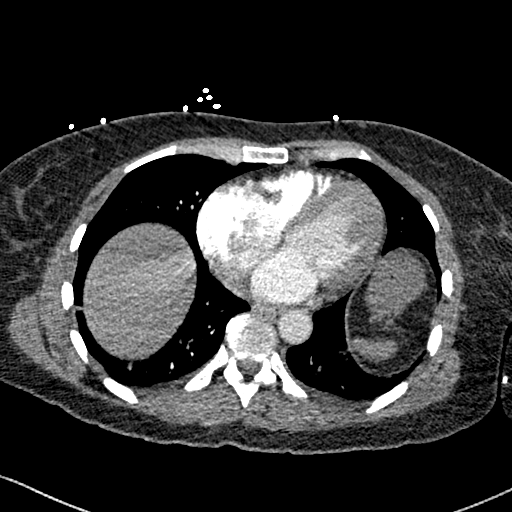
[im 116/266  lung]
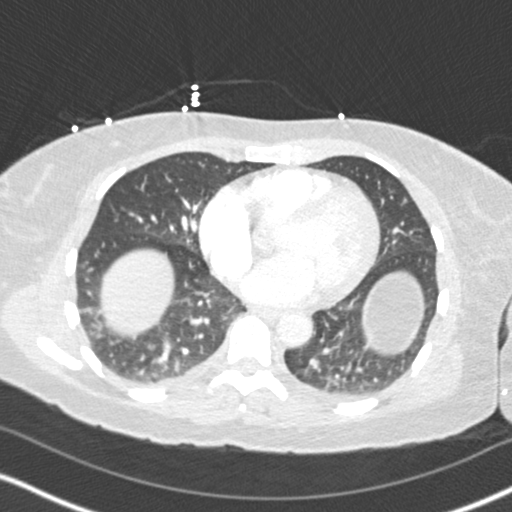
[im 139/266  soft-tissue]
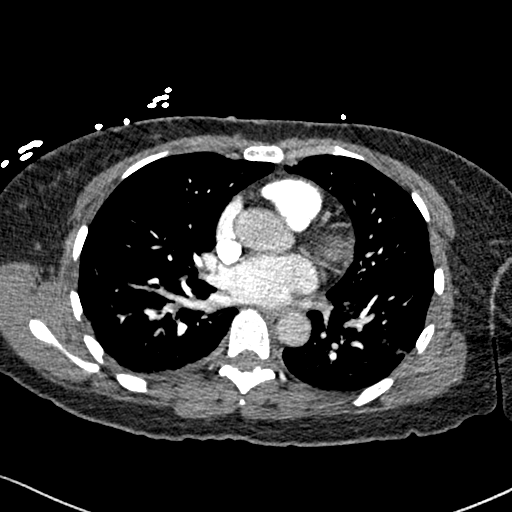
[im 150/266  lung]
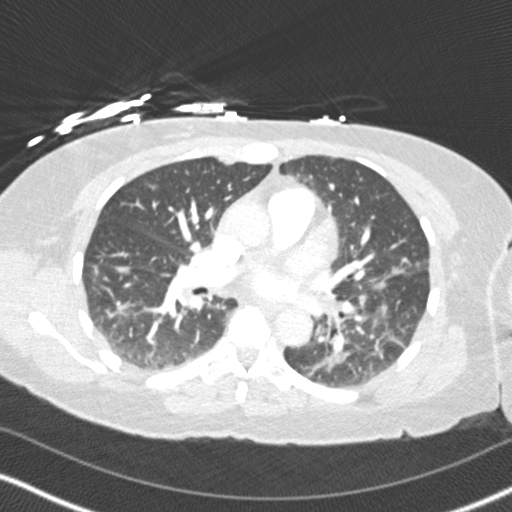
[im 162/266  soft-tissue]
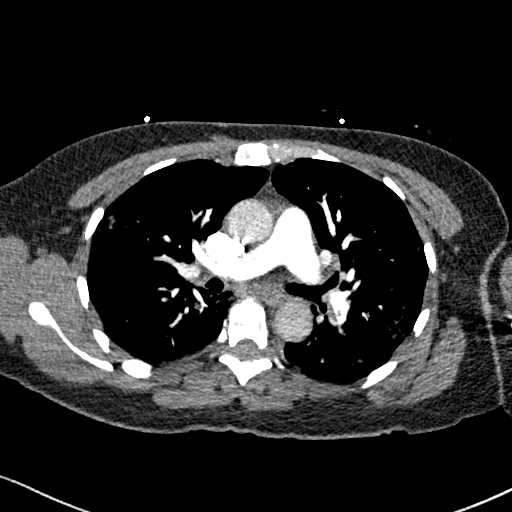
[im 185/266  lung]
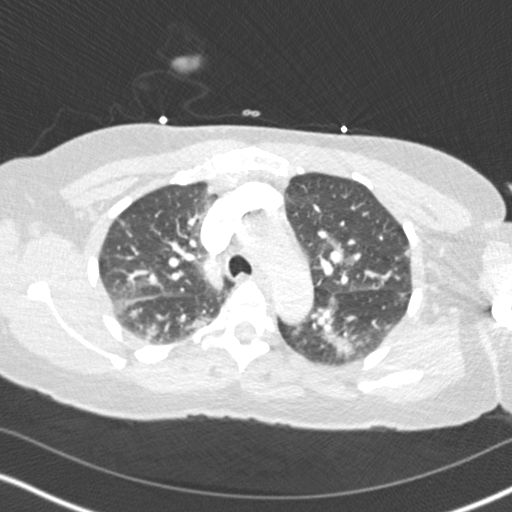
[im 196/266  soft-tissue]
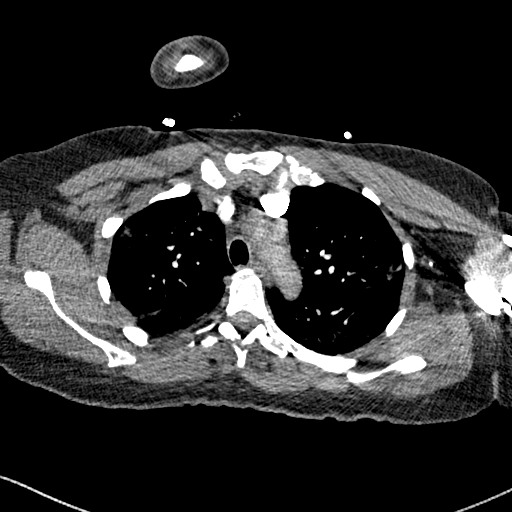
[im 219/266  lung]
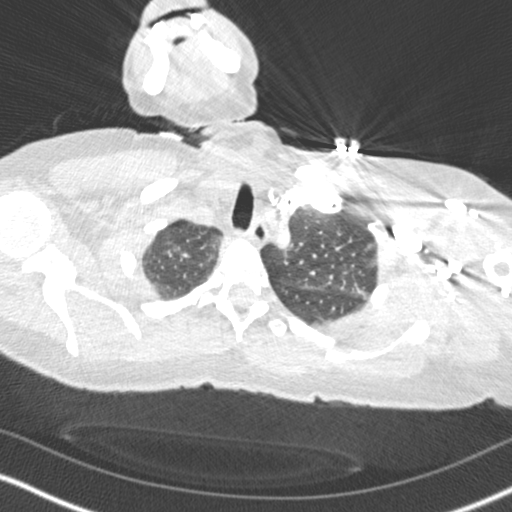
[im 231/266  soft-tissue]
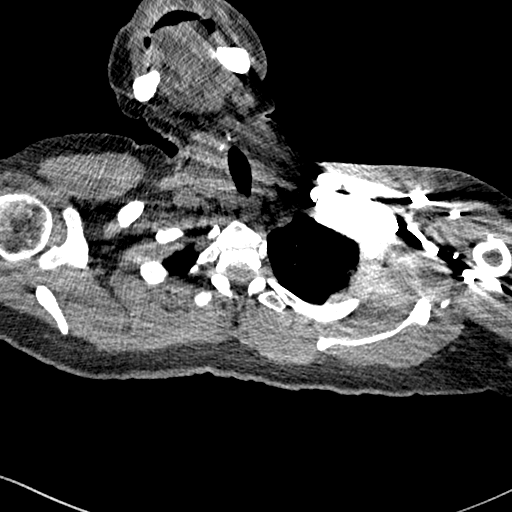
[im 254/266  lung]
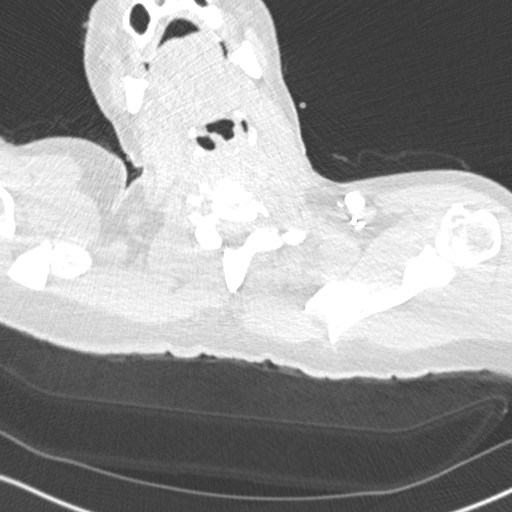

[Series 9: coronal mpr · coronal · 0.59mm/px · 3 of 102 slices shown]
[im 26/102  soft-tissue]
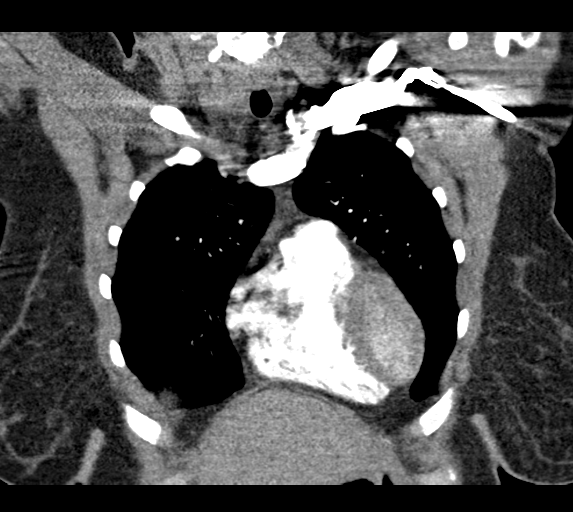
[im 51/102  soft-tissue]
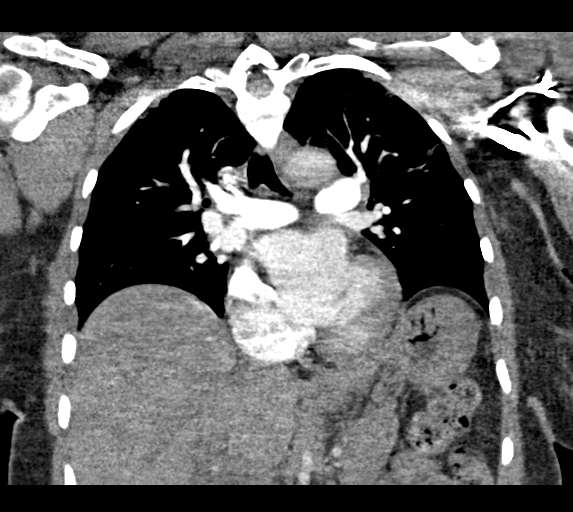
[im 76/102  soft-tissue]
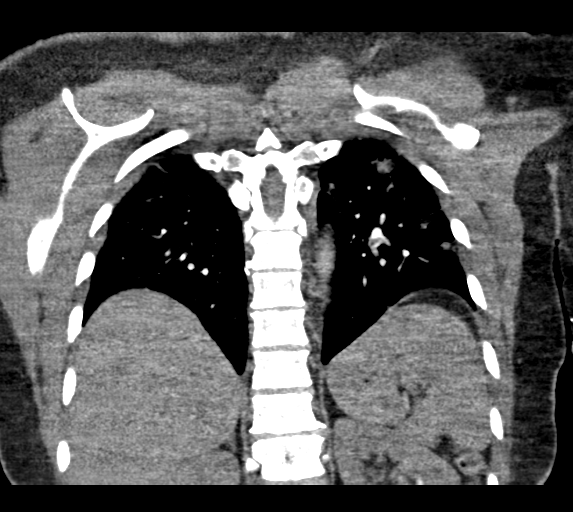

[18 of 46 positions shown; findings below may reference images not displayed]

FINDINGS: Cardiovascular: There is a focal pulmonary embolus, incompletely
obstructing, at the origin of the left lower lobe pulmonary artery.
A few small incompletely obstructing pulmonary emboli extend into
left upper lobe pulmonary artery branches. No completely obstructing
pulmonary embolus is evident. No evident right heart strain.

There is no appreciable thoracic aortic aneurysm or dissection.
Visualized great vessels appear unremarkable. There is no
pericardial effusion or pericardial thickening.

Mediastinum/Nodes: Thyroid appears unremarkable. No thoracic
adenopathy is appreciable. No esophageal lesions are evident.

Lungs/Pleura: There are small free-flowing pleural effusions. There
is patchy airspace opacity in each upper and lower lobe, slightly
more notable in the lower lobes than elsewhere. There is overall
somewhat less airspace opacity compared to the prior study from 3
weeks prior.

Upper Abdomen: Visualized upper abdominal structures appear
unremarkable.

Musculoskeletal: No blastic or lytic bone lesions. No chest wall
lesions are evident.

Review of the MIP images confirms the above findings.
IMPRESSION: 1. Incompletely obstructing pulmonary emboli on the left at the
origin of the left lower lobe pulmonary artery as well as in several
left upper lobe pulmonary artery branches. No right heart strain.

2. Patchy infiltrate bilaterally, slightly less than on the study
from 3 weeks prior. Small pleural effusions bilaterally.

3.  No evident pleural effusion.

Critical Value/emergent results were called by telephone at the time
of interpretation on 03/26/2019 at [DATE] to [REDACTED] ,
who verbally acknowledged these results.

## 2021-02-07 IMAGING — DX DG CHEST 1V PORT
1 series · 1 of 1 positions shown · non-contrast
Comparison: 03/07/2019

CLINICAL DATA: Chest pain and fevers. [id number] positive.

EXAM:
PORTABLE CHEST 1 VIEW

[chest ap]
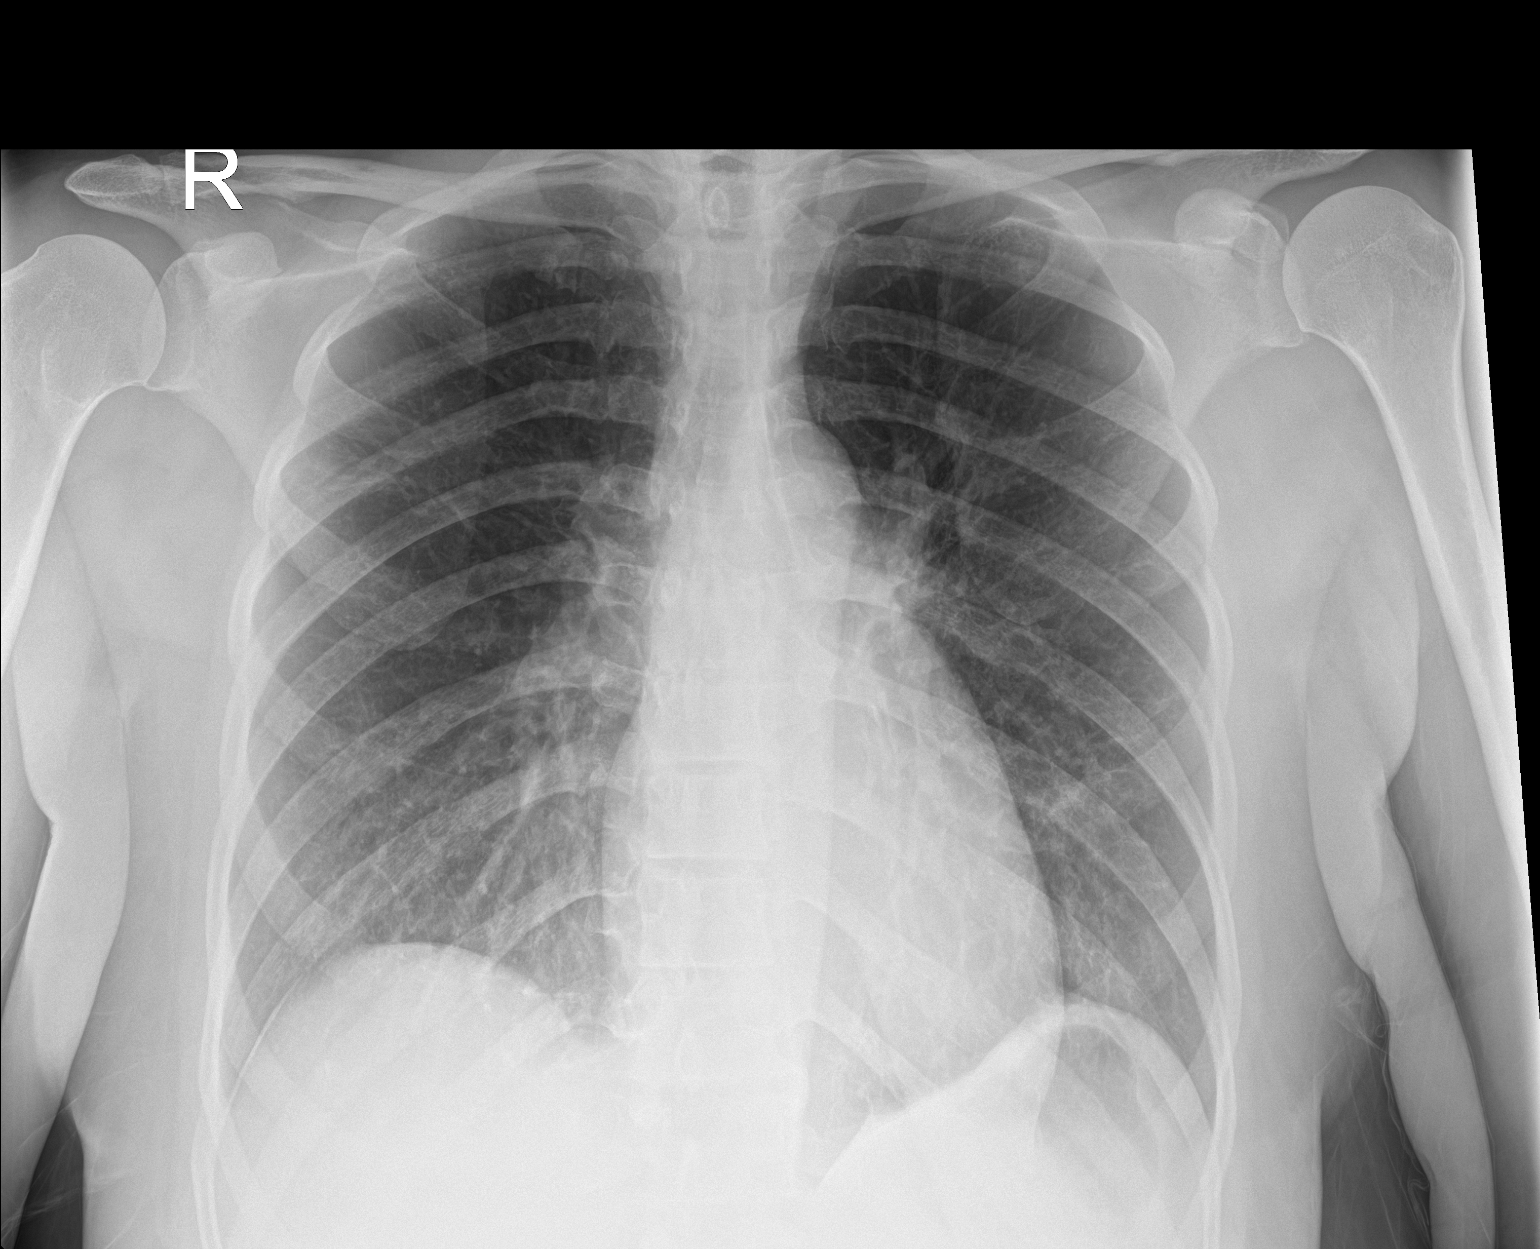

[1 of 1 positions shown; findings below may reference images not displayed]

FINDINGS: The cardiomediastinal silhouette is within normal limits. There are
persistent mild ill-defined opacities in the right upper lobe and
left mid to lower lung, with overall slight improvement of the
left-sided opacities. No new airspace opacity, edema, pleural
effusion, pneumothorax is identified. No acute osseous abnormality
is seen.
IMPRESSION: Slightly improved appearance of the chest with persistent mild
ill-defined opacities in the left greater than right lungs which
could reflect atypical or viral infection.
